# Patient Record
Sex: Male | Born: 1950 | Race: Black or African American | Hispanic: No | Marital: Single
Health system: Midwestern US, Community
[De-identification: ages and names within clinical notes are randomized; demographics above are authoritative.]

## PROBLEM LIST (undated history)

## (undated) DIAGNOSIS — J189 Pneumonia, unspecified organism: Secondary | ICD-10-CM

## (undated) DIAGNOSIS — I10 Essential (primary) hypertension: Secondary | ICD-10-CM

## (undated) DIAGNOSIS — E119 Type 2 diabetes mellitus without complications: Secondary | ICD-10-CM

## (undated) HISTORY — PX: APPENDECTOMY: SHX54

---

## 2007-08-06 NOTE — ED Provider Notes (Signed)
Back Pain   Associated symptoms include abdominal pain.   Abdominal Pain   Associated symptoms include back pain.        Past Medical History   Diagnosis Date   ??? Hypertension    ??? Diabetes           Past Surgical History   Procedure Date   ??? Hx appendectomy            No family history on file.     History   Social History   ??? Marital Status: Single     Spouse Name: N/A     Number of Children: N/A   ??? Years of Education: N/A   Occupational History   ??? Not on file.   Social History Main Topics   ??? Tobacco Use: Yes -- 0.5 packs/day   ??? Alcohol Use: Yes   ??? Drug Use: No   ??? Sexually Active:    Other Topics Concern   ??? Not on file   Social History Narrative   ??? No narrative on file           ALLERGIES: Review of patient's allergies indicates no known allergies.      Review of Systems   Gastrointestinal: Positive for abdominal pain.   Musculoskeletal: Positive for back pain.       Filed Vitals:    08/06/2007 12:46 PM   BP: 108/59   Pulse: 71   Temp: 97.9 ??F (36.6 ??C)   Resp: 16   Height: 5\' 11"  (1.803 m)   Weight: 202 lb (91.627 kg)   SpO2: 97%              Physical Exam         Coding      The patient has been evaluated and determined not to have a medical emergency. Vital signs are stable and no acute distress noted. Pleasant and cooperative. Medical screening has been explained to the patient with follow up clinics given to patient.

## 2013-11-27 ENCOUNTER — Emergency Department (HOSPITAL_COMMUNITY): Payer: Self-pay

## 2013-11-27 ENCOUNTER — Emergency Department (HOSPITAL_COMMUNITY)
Admission: EM | Admit: 2013-11-27 | Discharge: 2013-11-27 | Disposition: A | Discharge status: Home / Self Care | Payer: Self-pay | Attending: Emergency Medicine | Admitting: Emergency Medicine

## 2013-11-27 ENCOUNTER — Encounter (HOSPITAL_COMMUNITY): Payer: Self-pay | Admitting: Emergency Medicine

## 2013-11-27 DIAGNOSIS — I10 Essential (primary) hypertension: Secondary | ICD-10-CM | POA: Insufficient documentation

## 2013-11-27 DIAGNOSIS — R1110 Vomiting, unspecified: Secondary | ICD-10-CM

## 2013-11-27 DIAGNOSIS — Z79899 Other long term (current) drug therapy: Secondary | ICD-10-CM | POA: Insufficient documentation

## 2013-11-27 DIAGNOSIS — E119 Type 2 diabetes mellitus without complications: Secondary | ICD-10-CM | POA: Insufficient documentation

## 2013-11-27 DIAGNOSIS — Z9089 Acquired absence of other organs: Secondary | ICD-10-CM | POA: Insufficient documentation

## 2013-11-27 DIAGNOSIS — Z720 Tobacco use: Secondary | ICD-10-CM | POA: Insufficient documentation

## 2013-11-27 DIAGNOSIS — K529 Noninfective gastroenteritis and colitis, unspecified: Secondary | ICD-10-CM | POA: Insufficient documentation

## 2013-11-27 HISTORY — DX: Type 2 diabetes mellitus without complications: E11.9

## 2013-11-27 HISTORY — DX: Essential (primary) hypertension: I10

## 2013-11-27 LAB — COMPREHENSIVE METABOLIC PANEL WITH GFR
ALT: 39 U/L (ref 0–53)
AST: 52 U/L — ABNORMAL HIGH (ref 0–37)
Albumin: 4.1 g/dL (ref 3.5–5.2)
Alkaline Phosphatase: 135 U/L — ABNORMAL HIGH (ref 39–117)
Anion gap: 17 — ABNORMAL HIGH (ref 5–15)
BUN: 17 mg/dL (ref 6–23)
CO2: 26 meq/L (ref 19–32)
Calcium: 10.1 mg/dL (ref 8.4–10.5)
Chloride: 95 meq/L — ABNORMAL LOW (ref 96–112)
Creatinine, Ser: 0.87 mg/dL (ref 0.50–1.35)
GFR calc Af Amer: >90 mL/min
GFR calc non Af Amer: 89 mL/min — ABNORMAL LOW
Glucose, Bld: 131 mg/dL — ABNORMAL HIGH (ref 70–99)
Potassium: 4.5 meq/L (ref 3.7–5.3)
Sodium: 138 meq/L (ref 137–147)
Total Bilirubin: 0.5 mg/dL (ref 0.3–1.2)
Total Protein: 8.5 g/dL — ABNORMAL HIGH (ref 6.0–8.3)

## 2013-11-27 LAB — CBC WITH DIFFERENTIAL/PLATELET
BASOS PCT: 0 % (ref 0–1)
Basophils Absolute: 0 10*3/uL (ref 0.0–0.1)
EOS ABS: 0 10*3/uL (ref 0.0–0.7)
EOS PCT: 0 % (ref 0–5)
HCT: 43.2 % (ref 39.0–52.0)
Hemoglobin: 14.7 g/dL (ref 13.0–17.0)
LYMPHS ABS: 1.3 10*3/uL (ref 0.7–4.0)
Lymphocytes Relative: 11 % — ABNORMAL LOW (ref 12–46)
MCH: 27 pg (ref 26.0–34.0)
MCHC: 34 g/dL (ref 30.0–36.0)
MCV: 79.3 fL (ref 78.0–100.0)
Monocytes Absolute: 0.7 10*3/uL (ref 0.1–1.0)
Monocytes Relative: 6 % (ref 3–12)
NEUTROS PCT: 83 % — AB (ref 43–77)
Neutro Abs: 9.2 10*3/uL — ABNORMAL HIGH (ref 1.7–7.7)
Platelets: 293 10*3/uL (ref 150–400)
RBC: 5.45 MIL/uL (ref 4.22–5.81)
RDW: 12.7 % (ref 11.5–15.5)
WBC: 11.1 10*3/uL — ABNORMAL HIGH (ref 4.0–10.5)

## 2013-11-27 LAB — LIPASE, BLOOD: Lipase: 65 U/L — ABNORMAL HIGH (ref 11–59)

## 2013-11-27 MED ORDER — ONDANSETRON 4 MG PO TBDP: ORAL_TABLET | ORAL | Status: AC

## 2013-11-27 MED ORDER — LISINOPRIL 10 MG PO TABS: 20.0000 mg | ORAL_TABLET | Freq: Every day | ORAL | Status: AC

## 2013-11-27 MED ORDER — DICYCLOMINE HCL 20 MG PO TABS: ORAL_TABLET | ORAL | Status: AC

## 2013-11-27 MED ORDER — ONDANSETRON HCL 4 MG/2ML IJ SOLN
4.0000 mg | Freq: Once | INTRAMUSCULAR | Status: DC
  Filled 2013-11-27: qty 2

## 2013-11-27 MED ORDER — SODIUM CHLORIDE 0.9 % IV BOLUS (SEPSIS)
1000.0000 mL | Freq: Once | INTRAVENOUS | Status: AC
  Administered 2013-11-27: 1000 mL via INTRAVENOUS

## 2013-11-27 MED ORDER — ONDANSETRON 4 MG PO TBDP
4.0000 mg | ORAL_TABLET | Freq: Once | ORAL | Status: AC
  Administered 2013-11-27: 4 mg via ORAL
  Filled 2013-11-27: qty 1

## 2013-11-27 NOTE — ED Notes (Signed)
Pt given ginger ale.

## 2013-11-27 NOTE — ED Notes (Addendum)
63 yo male c/o of N/V and decreased intake for 4 days. Denies Chest pain or SOB. A/O X4 . HX of heroine use x7 days a week.

## 2013-11-27 NOTE — ED Notes (Signed)
IV Team at bedside 

## 2013-11-27 NOTE — Discharge Instructions (Signed)
Drink plenty of fluids and follow up if not improving. °

## 2013-11-27 NOTE — ED Provider Notes (Signed)
CSN: [id number]     Arrival date & time 11/27/13  0745 History   First MD Initiated Contact with Patient 11/27/13 [phone]     Chief Complaint  Patient presents with  . Nausea  . Emesis  . Dehydration     (Consider location/radiation/quality/duration/timing/severity/associated sxs/prior Treatment) Patient is a 63 y.o. male presenting with vomiting. The history is provided by the patient (pt complains of vomiting for a couple days).  Emesis Severity:  Moderate Timing:  Constant Quality:  Undigested food Feeding tolerance: nothing. Progression:  Unchanged Chronicity:  New Recent urination:  Normal Associated symptoms: no abdominal pain, no diarrhea and no headaches     Past Medical History  Diagnosis Date  . Diabetes mellitus without complication   . Hypertension    Past Surgical History  Procedure Laterality Date  . Appendectomy     History reviewed. No pertinent family history. History  Substance Use Topics  . Smoking status: Light Tobacco Smoker -- 0.25 packs/day    Types: Cigarettes  . Smokeless tobacco: Not on file  . Alcohol Use: 3.6 oz/week    6 Cans of beer per week    Review of Systems  Constitutional: Negative for appetite change and fatigue.  HENT: Negative for congestion, ear discharge and sinus pressure.   Eyes: Negative for discharge.  Respiratory: Negative for cough.   Cardiovascular: Negative for chest pain.  Gastrointestinal: Positive for vomiting. Negative for abdominal pain and diarrhea.  Genitourinary: Negative for frequency and hematuria.  Musculoskeletal: Negative for back pain.  Skin: Negative for rash.  Neurological: Negative for seizures and headaches.  Psychiatric/Behavioral: Negative for hallucinations.      Allergies  Review of patient's allergies indicates no known allergies.  Home Medications   Prior to Admission medications   Medication Sig Start Date End Date Taking? Authorizing Provider  lisinopril (PRINIVIL,ZESTRIL) 10 MG  tablet Take 10 mg by mouth daily.   Yes Historical Provider, MD   BP 152/76 mmHg  Pulse 60  Temp(Src) 99.3 F (37.4 C) (Oral)  Resp 17  Ht 5\' 11"  (1.803 m)  SpO2 96% Physical Exam  Constitutional: He is oriented to person, place, and time. He appears well-developed.  HENT:  Head: Normocephalic.  Eyes: Conjunctivae and EOM are normal. No scleral icterus.  Neck: Neck supple. No thyromegaly present.  Cardiovascular: Normal rate and regular rhythm.  Exam reveals no gallop and no friction rub.   No murmur heard. Pulmonary/Chest: No stridor. He has no wheezes. He has no rales. He exhibits no tenderness.  Abdominal: He exhibits no distension. There is no tenderness. There is no rebound.  Musculoskeletal: Normal range of motion. He exhibits no edema.  Lymphadenopathy:    He has no cervical adenopathy.  Neurological: He is oriented to person, place, and time. He exhibits normal muscle tone. Coordination normal.  Skin: No rash noted. No erythema.  Psychiatric: He has a normal mood and affect. His behavior is normal.    ED Course  Procedures (including critical care time) Labs Review Labs Reviewed  CBC WITH DIFFERENTIAL - Abnormal; Notable for the following:    WBC 11.1 (*)    Neutrophils Relative % 83 (*)    Neutro Abs 9.2 (*)    Lymphocytes Relative 11 (*)    All other components within normal limits  COMPREHENSIVE METABOLIC PANEL - Abnormal; Notable for the following:    Chloride 95 (*)    Glucose, Bld 131 (*)    Total Protein 8.5 (*)    AST 52 (*)  Alkaline Phosphatase 135 (*)    GFR calc non Af Amer 89 (*)    Anion gap 17 (*)    All other components within normal limits  LIPASE, BLOOD - Abnormal; Notable for the following:    Lipase 65 (*)    All other components within normal limits    Imaging Review Dg Abd Acute W/chest  11/27/2013   CLINICAL DATA:  Nausea and vomiting for 4 days  EXAM: ACUTE ABDOMEN SERIES (ABDOMEN 2 VIEW & CHEST 1 VIEW)  COMPARISON:  None.   FINDINGS: Cardiomediastinal silhouette is unremarkable. No acute infiltrate or pleural effusion. No pulmonary edema. There is nonobstructive small bowel gas pattern. Mild gaseous distension of the right colon and transverse colon without colonic air-fluid levels. Mild colonic ileus or colitis cannot be excluded. Clinical correlation is necessary. No free abdominal air.  IMPRESSION: No acute disease within chest. Mild gaseous distension of the right colon and transverse colon. Mild colonic ileus or colitis cannot be excluded. Clinical correlation is necessary. No free abdominal air.   Electronically Signed   By: Natasha MeadLiviu  Pop M.D.   On: 11/27/2013 08:37     EKG Interpretation None      MDM   Final diagnoses:  Vomiting    Pt drank 500 cc of ginger ale and did not vomit.  He will be sent home with zofran      Benny LennertJoseph L Ashely Goosby, MD 11/27/13 [phone]

## 2013-11-27 NOTE — ED Notes (Signed)
Pt tolerating PO intake

## 2013-11-27 NOTE — ED Notes (Signed)
This RN attempted IV Ultrasound x2. Unsuccessful

## 2014-03-16 ENCOUNTER — Inpatient Hospital Stay
Admit: 2014-03-16 | Discharge: 2014-03-16 | Disposition: A | Discharge status: Home / Self Care | Payer: Self-pay | Attending: Emergency Medicine

## 2014-03-16 ENCOUNTER — Emergency Department: Admit: 2014-03-16 | Payer: Self-pay

## 2014-03-16 DIAGNOSIS — S134XXA Sprain of ligaments of cervical spine, initial encounter: Secondary | ICD-10-CM

## 2014-03-16 MED ORDER — CYCLOBENZAPRINE 10 MG TAB
10 mg | ORAL_TABLET | Freq: Three times a day (TID) | ORAL | Status: DC | PRN
Start: 2014-03-16 — End: 2014-05-14
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
## 2014-03-16 NOTE — ED Notes (Signed)
I have reviewed discharge instructions with the patient.  The patient verbalized understanding. Given instructions and 1 script ambulatory at discharge gait steady

## 2014-05-14 ENCOUNTER — Emergency Department: Admit: 2014-05-14 | Payer: Self-pay

## 2014-05-14 ENCOUNTER — Inpatient Hospital Stay
Admit: 2014-05-14 | Discharge: 2014-05-16 | Disposition: A | Discharge status: Home / Self Care | Payer: Self-pay | Attending: Pulmonary Disease | Admitting: Pulmonary Disease

## 2014-05-14 DIAGNOSIS — J189 Pneumonia, unspecified organism: Principal | ICD-10-CM

## 2014-05-14 LAB — METABOLIC PANEL, COMPREHENSIVE
A-G Ratio: 0.5 — ABNORMAL LOW (ref 1.2–3.5)
ALT (SGPT): 63 U/L (ref 12–65)
AST (SGOT): 60 U/L — ABNORMAL HIGH (ref 15–37)
Albumin: 3.2 g/dL (ref 3.2–4.6)
Alk. phosphatase: 157 U/L — ABNORMAL HIGH (ref 50–136)
Anion gap: 10 mmol/L (ref 7–16)
BUN: 27 MG/DL — ABNORMAL HIGH (ref 8–23)
Bilirubin, total: 1.5 MG/DL — ABNORMAL HIGH (ref 0.2–1.1)
CO2: 27 mmol/L (ref 21–32)
Calcium: 9.5 MG/DL (ref 8.3–10.4)
Chloride: 98 mmol/L (ref 98–107)
Creatinine: 1.02 MG/DL (ref 0.8–1.5)
GFR est AA: >60 mL/min/{1.73_m2} (ref >60)
GFR est non-AA: >60 mL/min/{1.73_m2} (ref >60)
Globulin: 6.6 g/dL — ABNORMAL HIGH (ref 2.3–3.5)
Glucose: 127 mg/dL — ABNORMAL HIGH (ref 65–100)
Potassium: 3.2 mmol/L — ABNORMAL LOW (ref 3.5–5.1)
Protein, total: 9.8 g/dL — ABNORMAL HIGH (ref 6.3–8.2)
Sodium: 135 mmol/L — ABNORMAL LOW (ref 136–145)

## 2014-05-14 LAB — LACTIC ACID: Lactic acid: 1.9 MMOL/L (ref 0.4–2.0)

## 2014-05-14 LAB — CBC WITH AUTOMATED DIFF
ABS. BASOPHILS: 0 10*3/uL (ref 0.0–0.2)
ABS. EOSINOPHILS: 0 10*3/uL (ref 0.0–0.8)
ABS. IMM. GRANS.: 0.2 10*3/uL (ref 0.0–0.5)
ABS. LYMPHOCYTES: 1.4 10*3/uL (ref 0.5–4.6)
ABS. MONOCYTES: 1.1 10*3/uL (ref 0.1–1.3)
ABS. NEUTROPHILS: 15.6 10*3/uL — ABNORMAL HIGH (ref 1.7–8.2)
BASOPHILS: 0 % (ref 0.0–2.0)
EOSINOPHILS: 0 % — ABNORMAL LOW (ref 0.5–7.8)
HCT: 43.1 % (ref 41.1–50.3)
HGB: 14.9 g/dL (ref 13.6–17.2)
LYMPHOCYTES: 8 % — ABNORMAL LOW (ref 13–44)
MCH: 27.4 PG (ref 26.1–32.9)
MCHC: 34.6 g/dL (ref 31.4–35.0)
MCV: 79.2 FL — ABNORMAL LOW (ref 79.6–97.8)
MONOCYTES: 6 % (ref 4.0–12.0)
MPV: 10.1 FL — ABNORMAL LOW (ref 10.8–14.1)
NEUTROPHILS: 86 % — ABNORMAL HIGH (ref 43–78)
PLATELET COMMENTS: ADEQUATE
PLATELET: 276 10*3/uL (ref 150–450)
RBC: 5.44 M/uL (ref 4.23–5.67)
RDW: 12.9 % (ref 11.9–14.6)
WBC: 18.1 10*3/uL — ABNORMAL HIGH (ref 4.3–11.1)

## 2014-05-14 LAB — BNP: BNP: 26 pg/mL

## 2014-05-14 LAB — LIPASE: Lipase: 198 U/L (ref 73–393)

## 2014-05-14 LAB — PROCALCITONIN: Procalcitonin: 2.4 ng/mL

## 2014-05-14 MED ORDER — SODIUM CHLORIDE 0.9% BOLUS IV
0.9 % | Freq: Once | INTRAVENOUS | Status: AC
Start: 2014-05-14 — End: 2014-05-14
  Administered 2014-05-14: 21:00:00 via INTRAVENOUS

## 2014-05-14 MED ORDER — SODIUM CHLORIDE 0.9% BOLUS IV
0.9 % | Freq: Once | INTRAVENOUS | Status: DC
Start: 2014-05-14 — End: 2014-05-14

## 2014-05-14 MED ORDER — IPRATROPIUM-ALBUTEROL 2.5 MG-0.5 MG/3 ML NEB SOLUTION
2.5 mg-0.5 mg/3 ml | RESPIRATORY_TRACT | Status: AC
Start: 2014-05-14 — End: 2014-05-14
  Administered 2014-05-14: 20:00:00 via RESPIRATORY_TRACT

## 2014-05-14 MED ORDER — LEVOFLOXACIN IN D5W 750 MG/150 ML IV PIGGY BACK
750 mg/150 mL | INTRAVENOUS | Status: AC
Start: 2014-05-14 — End: 2014-05-14
  Administered 2014-05-14: 22:00:00 via INTRAVENOUS

## 2014-05-14 MED FILL — LEVOFLOXACIN IN D5W 750 MG/150 ML IV PIGGY BACK: 750 mg/150 mL | INTRAVENOUS | Qty: 150

## 2014-05-14 MED FILL — IPRATROPIUM-ALBUTEROL 2.5 MG-0.5 MG/3 ML NEB SOLUTION: 2.5 mg-0.5 mg/3 ml | RESPIRATORY_TRACT | Qty: 3

## 2014-05-14 NOTE — H&P (Signed)
HISTORY AND PHYSICAL      Gene King    05/14/2014    Date of Admission:  05/14/2014    The patient's chart is reviewed and the patient is discussed with the staff.    Subjective:     Patient is a 64 y.o. African American male admitted to the floor from the ED with CAP.  His initial complaints focused on some abdominal pain over the last few days, an d he says he has not eaten anything in 5 days.  Earlier today, he became short of breath and started coughing.  Abdominal series was obtained,  and he was noted to have in filtrates on the right.  He is now admitted for further management of CAP.       Review of Systems  Review of systems not obtained due to patient factors.      Patient Active Problem List   Diagnosis Code   ??? CAP (community acquired pneumonia) J18.9   ??? Leukocytosis D72.829   ??? Essential hypertension I10   ??? Diabetes mellitus type 2, controlled (HCC) E11.9       Prior to Admission Medications   Prescriptions Last Dose Informant Patient Reported? Taking?   cyclobenzaprine (FLEXERIL) 10 mg tablet   Yes No      Facility-Administered Medications: None       History reviewed. No pertinent past medical history.  Past Surgical History   Procedure Laterality Date   ??? Hx appendectomy       History     Social History   ??? Marital Status: SINGLE     Spouse Name: N/A   ??? Number of Children: N/A   ??? Years of Education: N/A     Occupational History   ??? Landscaper      Retired     Social History Main Topics   ??? Smoking status: Current Every Day Smoker -- 0.50 packs/day for 40 years     Types: Cigarettes   ??? Smokeless tobacco: Never Used   ??? Alcohol Use: 6.0 oz/week     10 Standard drinks or equivalent per week   ??? Drug Use: No   ??? Sexual Activity: Not on file       Social History Narrative    There is no known exposure to TB.  There is no significant environmental or industrial exposure.         Family History   Problem Relation Age of Onset   ??? Other Father      Deceased    ??? Other Mother      In assisted living       No Known Allergies      Current Facility-Administered Medications   Medication Dose Route Frequency   ??? levofloxacin (LEVAQUIN) 750 mg in D5W IVPB  750 mg IntraVENous NOW     Current Outpatient Prescriptions   Medication Sig   ??? cyclobenzaprine (FLEXERIL) 10 mg tablet        Objective:     Filed Vitals:    05/14/14 1331 05/14/14 1546 05/14/14 1637 05/14/14 1744   BP:   179/85 178/90   Pulse:   65 67   Temp:       Resp:   16 17   Height:       Weight:       SpO2: 89% 94% 97% 98%       PHYSICAL EXAM     Constitutional:  the patient is well developed and in  no acute distress  EENMT:  Sclera clear, pupils equal, oral mucosa moist  Respiratory: coarse rhonchi on the right.  Cardiovascular:  RRR without M,G,R  Gastrointestinal: soft and non-tender; with positive bowel sounds.  Musculoskeletal: warm without cyanosis. There is no lower leg edema.  Skin:  no jaundice or rashes  Neurologic: no gross neuro deficits     Psychiatric:  Unable to assess at this time.      Chest Xray:      RUL and RML infiltrates.      Recent Labs      05/14/[id number]   WBC  18.1*   HGB  14.9   HCT  43.1   PLT  276     Recent Labs      17-May-2014   1625   NA  135*   K  3.2*   CL  98   GLU  127*   CO2  27   BUN  27*   CREA  1.02   CA  9.5   ALB  3.2   TBILI  1.5*   ALT  63   SGOT  60*   BNPP  26       Assessment:  (Medical Decision Making)     Hospital Problems  Date Reviewed: 2014-05-17          Codes Class Noted POA    * (Principal)CAP (community acquired pneumonia) ICD-10-CM: J18.9  ICD-9-CM: 486  May 17, 2014 Yes    Leukocytosis  Will start on Levaquin. ICD-10-CM: Y78.295  ICD-9-CM: 288.60  2014-05-17 Yes        Diabetes mellitus type 2, controlled (HCC) (Chronic)  On no medicines.  Will check A1C. ICD-10-CM: E11.9  ICD-9-CM: 250.00  05/17/14 Yes              Plan:  (Medical Decision Making)     --Will admit for further medical management  --Supplemental O2   --Respiratory nebulizer treatments  --culture   --antibiotic therapy    More than 50% of the time documented was spent in face-to-face contact with the patient and in the care of the patient on the floor/unit where the patient is located.    Sharmon Leyden, MD

## 2014-05-14 NOTE — ED Notes (Signed)
25 minutes provided for receiving RN to review chart and call with questions.

## 2014-05-14 NOTE — Progress Notes (Signed)
TRANSFER - IN REPORT:    Verbal report received from Orvan FalconerJoseph Larson, RN on Queen BlossomJames Edward Langill  being received from Ed for routine progression of care      Report consisted of patient???s Situation, Background, Assessment and   Recommendations(SBAR).     Information from the following report(s) SBAR, Kardex and ED Summary was reviewed with the receiving nurse.    Opportunity for questions and clarification was provided.      Assessment completed upon patient???s arrival to unit and care assumed.

## 2014-05-14 NOTE — ED Notes (Signed)
Unable to obtain blood for labs. Lab called to attempt blood draw. Dr. Lou Minerhorn aware of delay in patient care.

## 2014-05-14 NOTE — ED Provider Notes (Signed)
HPI Comments: ppatient is a 64 year old male who is coming in with some upper abdominal pain with some vomiting and diarrhea for the last few days.  He also reports subjective fever and has had a productive cough as well. He states he normally smokes some but does not have any chronic lung problems.  He has had an appendectomy in the past.    The history is provided by the patient.        Past Medical History:   Diagnosis Date   ??? Hypertension    ??? Diabetes [facility])        Past Surgical History:   Procedure Laterality Date   ??? Hx appendectomy           History reviewed. No pertinent family history.    History     Social History   ??? Marital Status: SINGLE     Spouse Name: N/A   ??? Number of Children: N/A   ??? Years of Education: N/A     Occupational History   ??? Not on file.     Social History Main Topics   ??? Smoking status: Current Every Day Smoker -- 0.50 packs/day   ??? Smokeless tobacco: Not on file   ??? Alcohol Use: Yes   ??? Drug Use: No   ??? Sexual Activity: Not on file     Other Topics Concern   ??? Not on file     Social History Narrative           ALLERGIES: Review of patient's allergies indicates no known allergies.      Review of Systems   Constitutional: Positive for fever and chills.   Respiratory: Negative for chest tightness, shortness of breath, wheezing and stridor.    Cardiovascular: Negative for chest pain and palpitations.   Gastrointestinal: Negative for nausea, vomiting, abdominal pain and diarrhea.   Skin: Negative.    All other systems reviewed and are negative.      Filed Vitals:    05/14/14 1327 05/14/14 1331   BP: 139/71    Pulse: 77    Temp: 97.8 ??F (36.6 ??C)    Resp: 16    Height: 5' 11"  (1.803 m)    Weight: 77.111 kg (170 lb)    SpO2: 87% 89%            Physical Exam   Constitutional: He is oriented to person, place, and time. He appears well-developed and well-nourished. No distress.   HENT:   Head: Normocephalic and atraumatic.   Eyes: Conjunctivae are normal. No scleral icterus.    Neck: Normal range of motion. Neck supple.   Cardiovascular: Normal rate, regular rhythm and normal heart sounds.    Pulmonary/Chest: Effort normal and breath sounds normal. No stridor. No respiratory distress. He has no rales. He exhibits no tenderness.   Scattered wheezes and rhonchi intermittent coughing   Abdominal: Soft. There is no tenderness. There is no rebound and no guarding.   Neurological: He is alert and oriented to person, place, and time.   No focal weakness   Skin: Skin is warm and dry. No rash noted. He is not diaphoretic.   Psychiatric: He has a normal mood and affect. His behavior is normal.   Nursing note and vitals reviewed.       MDM  Number of Diagnoses or Management Options  Community acquired pneumonia:   Diagnosis management comments: Patient's lungs still sound bad and he has a pneumonia on his x-ray and a white  count of 18,000 and getting cultures and       Amount and/or Complexity of Data Reviewed  Clinical lab tests: ordered and reviewed (Results for orders placed or performed during the hospital encounter of 05/14/14  -CBC WITH AUTOMATED DIFF       Result                                            Value                         Ref Range                       WBC                                               18.1 (H)                      4.3 - 11.1 K/uL                 RBC                                               5.44                          4.23 - 5.67 M/uL                HGB                                               14.9                          13.6 - 17.2 g/dL                HCT                                               43.1                          41.1 - 50.3 %                   MCV                                               79.2 (L)                      79.6 - 97.8 FL  MCH                                               27.4                          26.1 - 32.9 PG                  MCHC                                              34.6                           31.4 - 35.0 g/dL                RDW                                               12.9                          11.9 - 14.6 %                   PLATELET                                          276                           150 - 450 K/uL                  MPV                                               10.1 (L)                      10.8 - 14.1 FL                  NEUTROPHILS                                       86 (H)                        43 - 78 %                       LYMPHOCYTES                                       8 (L)  13 - 44 %                       MONOCYTES                                         6                             4.0 - 12.0 %                    EOSINOPHILS                                       0 (L)                         0.5 - 7.8 %                     BASOPHILS                                         0                             0.0 - 2.0 %                     ABS. NEUTROPHILS                                  15.6 (H)                      1.7 - 8.2 K/UL                  ABS. LYMPHOCYTES                                  1.4                           0.5 - 4.6 K/UL                  ABS. MONOCYTES                                    1.1                           0.1 - 1.3 K/UL                  ABS. EOSINOPHILS                                  0.0  0.0 - 0.8 K/UL                  ABS. BASOPHILS                                    0.0                           0.0 - 0.2 K/UL                  ABS. IMM. GRANS.                                  0.2                           0.0 - 0.5 K/UL                  RBC COMMENTS                                      SLIGHT POLYCHROMASIA                                          RBC COMMENTS                                                                                                OCCASIONAL   TARGET CELLS          PLATELET COMMENTS                                 ADEQUATE                                                       DF                                                AUTOMATED                                                -METABOLIC PANEL, COMPREHENSIVE       Result  Value                         Ref Range                       Sodium                                            135 (L)                       136 - 145 mmol/L                Potassium                                         3.2 (L)                       3.5 - 5.1 mmol/L                Chloride                                          98                            98 - 107 mmol/L                 CO2                                               27                            21 - 32 mmol/L                  Anion gap                                         10                            7 - 16 mmol/L                   Glucose                                           127 (H)                       65 - 100 mg/dL                  BUN  27 (H)                        8 - 23 MG/DL                    Creatinine                                        1.02                          0.8 - 1.5 MG/DL                 GFR est AA                                        >60                           >60 ml/min/1.84m               GFR est non-AA                                    >60                           >60 ml/min/1.724m              Calcium                                           9.5                           8.3 - 10.4 MG/DL                Bilirubin, total                                  1.5 (H)                       0.2 - 1.1 MG/DL                 ALT                                               63                            12 - 65 U/L                     AST  60 (H)                        15 - 37 U/L                     Alk. phosphatase                                  157 (H)                        50 - 136 U/L                    Protein, total                                    9.8 (H)                       6.3 - 8.2 g/dL                  Albumin                                           3.2                           3.2 - 4.6 g/dL                  Globulin                                          6.6 (H)                       2.3 - 3.5 g/dL                  A-G Ratio                                         0.5 (L)                       1.2 - 3.5                  -LIPASE       Result                                            Value                         Ref Range                       Lipase  198                           73 - 393 U/L               -BNP       Result                                            Value                         Ref Range                       BNP                                               26                            pg/mL                     )  Tests in the radiology section of CPT??: ordered and reviewed (Xr Abd Acute W 1 V Chest    05/14/2014   Acute abdominal series.  CLINICAL INDICATION: Severe lower abdominal pain with diarrhea for four days  FINDINGS: Patchy opacities noted in the right mid and lower lung. No free air noted beneath the diaphragm. Air-filled loops of nondilated large and small bowel noted throughout the entirety of the abdomen and pelvis including distally. No findings present to strongly suspect obstruction.     05/14/2014   IMPRESSION: Patchy airspace opacity in the right mid and lower lung. Pneumonia could be considered. The bowel gas pattern is nonobstructive.    )        Procedures

## 2014-05-14 NOTE — Progress Notes (Signed)
Respirations even and unlabored. No s/sx distress. No needs at present. No pain or SOB. Assessment completed.

## 2014-05-14 NOTE — ED Notes (Signed)
Pt with abd pain and fever that started this past Thursday. Pt with diarrhea.
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
## 2014-05-15 LAB — GLUCOSE, POC
Glucose (POC): 109 mg/dL — ABNORMAL HIGH (ref 65–100)
Glucose (POC): 139 mg/dL — ABNORMAL HIGH (ref 65–100)
Glucose (POC): 154 mg/dL — ABNORMAL HIGH (ref 65–100)
Glucose (POC): 144 mg/dL — ABNORMAL HIGH (ref 65–100)

## 2014-05-15 LAB — METABOLIC PANEL, BASIC
Anion gap: 9 mmol/L (ref 7–16)
BUN: 22 MG/DL (ref 8–23)
CO2: 23 mmol/L (ref 21–32)
Calcium: 8.4 MG/DL (ref 8.3–10.4)
Chloride: 105 mmol/L (ref 98–107)
Creatinine: 0.7 MG/DL — ABNORMAL LOW (ref 0.8–1.5)
GFR est AA: >60 mL/min/{1.73_m2} (ref >60)
GFR est non-AA: >60 mL/min/{1.73_m2} (ref >60)
Glucose: 127 mg/dL — ABNORMAL HIGH (ref 65–100)
Potassium: 3.3 mmol/L — ABNORMAL LOW (ref 3.5–5.1)
Sodium: 137 mmol/L (ref 136–145)

## 2014-05-15 LAB — HEMOGLOBIN A1C WITH EAG
Est. average glucose: 123 mg/dL
Hemoglobin A1c: 5.9 % (ref 4.8–6.0)

## 2014-05-15 LAB — MAGNESIUM: Magnesium: 2.2 mg/dL (ref 1.8–2.4)

## 2014-05-15 MED ORDER — POTASSIUM CHLORIDE SR 20 MEQ TAB, PARTICLES/CRYSTALS
20 mEq | ORAL | Status: AC
Start: 2014-05-15 — End: 2014-05-15
  Administered 2014-05-15: 14:00:00 via ORAL

## 2014-05-15 MED ORDER — SODIUM CHLORIDE 0.9 % IJ SYRG
Freq: Three times a day (TID) | INTRAMUSCULAR | Status: DC
Start: 2014-05-15 — End: 2014-05-16
  Administered 2014-05-15 – 2014-05-16 (×4): via INTRAVENOUS

## 2014-05-15 MED ORDER — ALBUTEROL SULFATE 0.083 % (0.83 MG/ML) SOLN FOR INHALATION
2.5 mg /3 mL (0.083 %) | Freq: Four times a day (QID) | RESPIRATORY_TRACT | Status: DC
Start: 2014-05-15 — End: 2014-05-16
  Administered 2014-05-15 – 2014-05-16 (×6): via RESPIRATORY_TRACT

## 2014-05-15 MED ORDER — VANCOMYCIN IN 0.9% SODIUM CHLORIDE 1.5 G/500 ML IV
1.5 g/500 mL | Freq: Once | INTRAVENOUS | Status: AC
Start: 2014-05-15 — End: 2014-05-16
  Administered 2014-05-16: via INTRAVENOUS

## 2014-05-15 MED ORDER — ENOXAPARIN 40 MG/0.4 ML SUB-Q SYRINGE
40 mg/0.4 mL | SUBCUTANEOUS | Status: DC
Start: 2014-05-15 — End: 2014-05-16
  Administered 2014-05-15 – 2014-05-16 (×2): via SUBCUTANEOUS

## 2014-05-15 MED ORDER — LEVOFLOXACIN IN D5W 750 MG/150 ML IV PIGGY BACK
750 mg/150 mL | INTRAVENOUS | Status: DC
Start: 2014-05-15 — End: 2014-05-15
  Administered 2014-05-15 – 2014-05-16 (×2): via INTRAVENOUS

## 2014-05-15 MED ORDER — SODIUM CHLORIDE 0.9 % IJ SYRG
INTRAMUSCULAR | Status: DC | PRN
Start: 2014-05-15 — End: 2014-05-16

## 2014-05-15 MED ORDER — GUAIFENESIN SR 600 MG TAB
600 mg | Freq: Two times a day (BID) | ORAL | Status: DC
Start: 2014-05-15 — End: 2014-05-16
  Administered 2014-05-15 – 2014-05-16 (×4): via ORAL

## 2014-05-15 MED ORDER — VANCOMYCIN IN 0.9 % SODIUM CHLORIDE 1.25 GRAM/250 ML IV
1.25 gram/250 mL | Freq: Two times a day (BID) | INTRAVENOUS | Status: DC
Start: 2014-05-15 — End: 2014-05-15

## 2014-05-15 MED ORDER — SODIUM CHLORIDE 0.9 % IV
INTRAVENOUS | Status: DC
Start: 2014-05-15 — End: 2014-05-15
  Administered 2014-05-15: 03:00:00 via INTRAVENOUS

## 2014-05-15 MED ORDER — POTASSIUM CHLORIDE SR 20 MEQ TAB, PARTICLES/CRYSTALS
20 mEq | Freq: Three times a day (TID) | ORAL | Status: AC
Start: 2014-05-15 — End: 2014-05-15
  Administered 2014-05-15 (×2): via ORAL

## 2014-05-15 MED ORDER — PNEUMOCOCCAL 23-VALPS VACCINE 25 MCG/0.5 ML INJECTION
25 mcg/0.5 mL | INTRAMUSCULAR | Status: AC
Start: 2014-05-15 — End: 2014-05-16
  Administered 2014-05-16: 14:00:00 via INTRAMUSCULAR

## 2014-05-15 MED FILL — KLOR-CON M20 MEQ TABLET,EXTENDED RELEASE: 20 mEq | ORAL | Qty: 2

## 2014-05-15 MED FILL — ALBUTEROL SULFATE 0.083 % (0.83 MG/ML) SOLN FOR INHALATION: 2.5 mg /3 mL (0.083 %) | RESPIRATORY_TRACT | Qty: 1

## 2014-05-15 MED FILL — LOVENOX 40 MG/0.4 ML SUBCUTANEOUS SYRINGE: 40 mg/0.4 mL | SUBCUTANEOUS | Qty: 0.4

## 2014-05-15 MED FILL — SODIUM CHLORIDE 0.9 % IV: INTRAVENOUS | Qty: 1000

## 2014-05-15 MED FILL — VANCOMYCIN IN 0.9% SODIUM CHLORIDE 1.5 G/500 ML IV: 1.5 g/500 mL | INTRAVENOUS | Qty: 500

## 2014-05-15 MED FILL — GUAIFENESIN SR 600 MG TAB: 600 mg | ORAL | Qty: 2

## 2014-05-15 MED FILL — LEVOFLOXACIN IN D5W 750 MG/150 ML IV PIGGY BACK: 750 mg/150 mL | INTRAVENOUS | Qty: 150

## 2014-05-15 NOTE — Progress Notes (Addendum)
Problem: Nutrition Deficit  Goal: *Optimize nutritional status  Nutrition    MST (malnutrition screen tool ) referral for reason of :  Recently lost weight loss w/o trying:  [x ]  Yes     If weight loss, how much?      [x]  unsure  Eating poorly due to decreased appetite?  [x]  yes  Assessment:  RD notes:  Pt reports lack of appetite since last Thursday with fluid intake but minimal solids. Does not endorse nausea or other GI issues as barriers to po. Denies special diet needs. Ate minimal amounts of a breakfast tray received this AM. Pt desires a bedtime snack but no interest expressed in a liquid nutritional but knows of their availability. RD explained "Catering to [employer]" menu program to pt and cited menu substitutions are permitted.   Pt says any wt loss he has had occurred b/w last Thursday and now. Denies pattern of wt loss prior.  Pt symptoms PTA: minimal po x 5 days, SOB, productive cough of yellow sputum, some diarrhea.  Below is connect care weight record for this patient. Based on connect care functionality, RD cannot know if weights are actual stated, or estimated. Weights suggest potential of 10 lb weight loss since 03-16-14 for a potential loss of 5.56% UBW suggestive of severe weight loss.  Vitals 05/15/2014 05/14/2014 03/16/2014   Systolic 166   122   Diastolic 89   75   Pulse 76   75   Temp 98.9   97.5   Respirations 20   16   Weight   170 180   Height   5\' 11"  5\' 11"    BMI   23.72 25.12   SpO2 94   99     Pt /Nutrition Hx:   64 yo M admitted with CAP  Pertinent medications:  K-Dur  Anthropometrics:              Ht = 5'11", wt = 77.111 kgs (weight source: *unknown) BMI of 23.8 c/w acceptable weight designation for patient.  Macronutrient Needs:              EER: [id number] calories per day based on 25-30 calories per kg recorded pt weight. (99 % IBW)              EPR: 93-108 grams per day based on 1.2-1.4 grams of protein per kg of recorded pt wt  (GFR >60 ml/min  )  Lab Results   Component Value Date/Time      POTASSIUM 3.3 05/15/2014 05:46 AM     SODIUM 137 05/15/2014 05:46 AM     MAGNESIUM 2.2 05/15/2014 05:46 AM     GLUCOSE 127 05/15/2014 05:46 AM     CREATININE 0.70 05/15/2014 05:46 AM     HEMOGLOBIN A1C 5.9 05/15/2014 05:46 AM     POC Glucoses:  139 mg/dl  Intake/Comparative Standards:   No meal intake data available.    Nutrition Diagnosis:   Inadequate oral intake related to decreased ability to consume sufficient energy as evidenced by patient report of lack of appetite and potential severe weight loss in a month of 5.56%.  Intervention:        Plan:   >   Meals and snacks: NPO, add HS snack per pt request once diet is ordered.  >   Feeding Assistance:  Menu selection assistance per protocol.  >   Nutrition Supplement Therapy: declines .   >   May be beneficial to offer patient  daily multivitamin.  >  * May be beneficial to weigh this patient if weight on EMR was not obtained here post admit.  Gwen PoundsSusan Hubbard RD,LD  [phone]

## 2014-05-15 NOTE — Progress Notes (Signed)
After repeated attempts x4 from 4 different nurses, attempts at IV access remain unsuccesful.

## 2014-05-15 NOTE — Progress Notes (Signed)
Patient sitting in bed.  Assisted with lunch tray.  Call light within reach.  Patient denies any needs at this time.  Will call for assistance.  Will continue to monitor.

## 2014-05-15 NOTE — Progress Notes (Signed)
Dr. Nelle DonAwan returned page. Notified of inability to regain IV access and requested to change to po antibiotics. Telephone orders received to discontinue IV Levaquin and IV Vancomycin and new orders received for Zyvox 600 mg po every 12 hours, and for Levaquin 750 mg po daily, and to order a PICC for tomorrow.  Orders read back for clarification.

## 2014-05-15 NOTE — Progress Notes (Signed)
Bedside report given to Darel HongJudy, Charity fundraiserN.  Engineering in to change bed out due to sparks coming from bed (unoccupied).  Patient IV infiltrated.  Levaquin spiked, but canceled on MAR.  Pharmacy to reschedule.  Vanc moved to night shift.  Unable to obtain IV access at this time.  Night shift will attempt.

## 2014-05-15 NOTE — Progress Notes (Signed)
Problem: Breathing Pattern - Ineffective  Goal: *Absence of hypoxia  Outcome: Progressing Towards Goal  97% on RA

## 2014-05-15 NOTE — Progress Notes (Addendum)
Admit with Pneumonia  Self pay, May not be eligable for Medicare- turned 64 this month.  Referred to Georg Ruddlehonda Lindsey with [facility].  Maple Mirzaonna McKinney from Financial assistance has seen patient.     Pneumonia education given. Poor appetite Nutrition has seen.     Pt lives with brother.  He does drive and is very active.      Jerrel IvoryJulie Davarion Cuffee, RN- [facility], BSN  COPD Care Transition Facilitator  [phone]

## 2014-05-15 NOTE — Progress Notes (Signed)
Engineering replaced bed.  Bed operational.  Pt back to bed.  IV attempt x2 unsuccesful.

## 2014-05-15 NOTE — Progress Notes (Addendum)
Gene King  Admission Date: 05/14/2014             Daily Progress Note: 05/15/2014    The patient's chart is reviewed and the patient is discussed with the staff.    64 y.o. AAM admitted to the floor from the ED with CAP.?? Initial complaints focused some abdominal pain over the last few days and he says he has not eaten anything in 5 days.?? He became short of breath and started coughing.?? Abdominal series was obtained,?? and he was noted to have in filtrates on the right.??     Subjective:     Sitting up in bed and states is feeling some better.  Occasional productive cough with yellow sputum.    Had diarrhea at home but none now.    Current Facility-Administered Medications   Medication Dose Route Frequency   ??? sodium chloride (NS) flush 5-10 mL  5-10 mL IntraVENous Q8H   ??? sodium chloride (NS) flush 5-10 mL  5-10 mL IntraVENous PRN   ??? albuterol (PROVENTIL VENTOLIN) nebulizer solution 2.5 mg  2.5 mg Nebulization Q6H RT   ??? guaiFENesin SR (MUCINEX) tablet 1,200 mg  1,200 mg Oral Q12H   ??? enoxaparin (LOVENOX) injection 40 mg  40 mg SubCUTAneous Q24H   ??? levofloxacin (LEVAQUIN) 750 mg in D5W IVPB  750 mg IntraVENous Q24H   ??? 0.9% sodium chloride infusion  100 mL/hr IntraVENous CONTINUOUS   ??? pneumococcal 23-valent (PNEUMOVAX 23) injection 0.5 mL  0.5 mL IntraMUSCular PRIOR TO DISCHARGE       Review of Systems  Constitutional: negative for fever, chills, sweats  Cardiovascular: negative for chest pain, palpitations, syncope, edema  Gastrointestinal:  negative for dysphagia, reflux, vomiting, diarrhea, abdominal pain, or melena  Neurologic:  negative for focal weakness, numbness, headache    Objective:     Filed Vitals:    05/14/14 2358 05/15/14 0124 05/15/14 0335 05/15/14 0730   BP: 171/83  162/87    Pulse: 68  67    Temp: 98.4 ??F (36.9 ??C)  97.8 ??F (36.6 ??C)    Resp: 18  18    Height:       Weight:       SpO2: 95% 98% 98% 97%     Intake and Output:           Physical Exam:    Constitution:  the patient is well developed and in no acute distress, room air, sat 98%  EENMT:  Sclera clear, pupils equal, oral mucosa moist  Respiratory: few anterior and posterior crackles, no wheezing  Cardiovascular:  RRR without M,G,R  Gastrointestinal: soft and non-tender; with positive bowel sounds.  Musculoskeletal: warm without cyanosis. There is no lower leg edema.  Skin:  no jaundice or rashes, no wounds   Neurologic: no gross neuro deficits     Psychiatric:  alert and oriented x 3    CHEST XRAY: None today      LAB  Recent Labs      05/15/14   0524  05/14/14   2155   GLUCPOC  139*  109*      Recent Labs      05/14/14   1625   WBC  18.1*   HGB  14.9   HCT  43.1   PLT  276     Recent Labs      05/15/14   0546  05/14/14   1625   NA  137  135*   K  3.3*  3.2*   CL  105  98   CO2  23  27   GLU  127*  127*   BUN  22  27*   CREA  0.70*  1.02   MG  2.2   --    CA  8.4  9.5   ALB   --   3.2   TBILI   --   1.5*   ALT   --   63   SGOT   --   60*   BNPP   --   26     No results for input(s): PH, PCO2, PO2, HCO3 in the last 72 hours.  Recent Labs      05/14/14   1910   LAC  1.9         Assessment:  (Medical Decision Making)     Hospital Problems  Date Reviewed: 05/14/2014          Codes Class Noted POA    * (Principal)CAP (community acquired pneumonia) ICD-10-CM: J18.9  ICD-9-CM: 486  05/14/2014 Yes    Continue current    Leukocytosis ICD-10-CM: D72.829  ICD-9-CM: 288.60  05/14/2014 Yes    Follow in AM    Diabetes mellitus type 2, controlled (HCC) (Chronic) ICD-10-CM: E11.9  ICD-9-CM: 250.00  05/14/2014 Yes    Chronic--ranges 109-139          Plan:  (Medical Decision Making)     --NS 15800ml/hr--stop  --Albuterol, Mucinex  --Levaquin day 2  --Blood cultures:  Pending  --K+ 3.3--supplement  --Follow up CBC in AM    More than 50% of the time documented was spent in face-to-face contact with the patient and in the care of the patient on the floor/unit where the patient is located.    Bobbye CharlestonSANDRA W LOWE, NP  Lungs:  Rhonchi R base   Heart:  RRR with no Murmur/Rubs/Gallops    Additional Comments:  Continue antibx    I have spoken with and examined the patient. I agree with the above assessment and plan as documented.    Allie BossierHARLES V MULLEN JR, MD

## 2014-05-15 NOTE — Progress Notes (Signed)
Gene King attempted to gain IV access, unsuccesfully.  Will ask Gene King from the Unit to try. Paged [facility] to see if we can switch to po antibiotics.

## 2014-05-16 LAB — METABOLIC PANEL, BASIC
Anion gap: 9 mmol/L (ref 7–16)
BUN: 11 MG/DL (ref 8–23)
CO2: 24 mmol/L (ref 21–32)
Calcium: 8.6 MG/DL (ref 8.3–10.4)
Chloride: 102 mmol/L (ref 98–107)
Creatinine: 0.83 MG/DL (ref 0.8–1.5)
GFR est AA: >60 mL/min/{1.73_m2} (ref >60)
GFR est non-AA: >60 mL/min/{1.73_m2} (ref >60)
Glucose: 156 mg/dL — ABNORMAL HIGH (ref 65–100)
Potassium: 3.4 mmol/L — ABNORMAL LOW (ref 3.5–5.1)
Sodium: 135 mmol/L — ABNORMAL LOW (ref 136–145)

## 2014-05-16 LAB — CBC WITH AUTOMATED DIFF
ABS. BASOPHILS: 0 10*3/uL (ref 0.0–0.2)
ABS. EOSINOPHILS: 0.1 10*3/uL (ref 0.0–0.8)
ABS. IMM. GRANS.: 0.2 10*3/uL (ref 0.0–0.5)
ABS. LYMPHOCYTES: 2.1 10*3/uL (ref 0.5–4.6)
ABS. MONOCYTES: 0.9 10*3/uL (ref 0.1–1.3)
ABS. NEUTROPHILS: 9.7 10*3/uL — ABNORMAL HIGH (ref 1.7–8.2)
BASOPHILS: 0 % (ref 0.0–2.0)
EOSINOPHILS: 1 % (ref 0.5–7.8)
HCT: 32.7 % — ABNORMAL LOW (ref 41.1–50.3)
HGB: 10.8 g/dL — ABNORMAL LOW (ref 13.6–17.2)
IMMATURE GRANULOCYTES: 1.4 % (ref 0.0–5.0)
LYMPHOCYTES: 16 % (ref 13–44)
MCH: 26.5 PG (ref 26.1–32.9)
MCHC: 33 g/dL (ref 31.4–35.0)
MCV: 80.1 FL (ref 79.6–97.8)
MONOCYTES: 7 % (ref 4.0–12.0)
MPV: 10.7 FL — ABNORMAL LOW (ref 10.8–14.1)
NEUTROPHILS: 75 % (ref 43–78)
PLATELET: 262 10*3/uL (ref 150–450)
RBC: 4.08 M/uL — ABNORMAL LOW (ref 4.23–5.67)
RDW: 13 % (ref 11.9–14.6)
WBC: 12.9 10*3/uL — ABNORMAL HIGH (ref 4.3–11.1)

## 2014-05-16 LAB — GLUCOSE, POC
Glucose (POC): 159 mg/dL — ABNORMAL HIGH (ref 65–100)
Glucose (POC): 140 mg/dL — ABNORMAL HIGH (ref 65–100)

## 2014-05-16 MED ORDER — LEVOFLOXACIN 250 MG TAB
250 mg | ORAL | Status: DC
Start: 2014-05-16 — End: 2014-05-16
  Administered 2014-05-16: 03:00:00 via ORAL

## 2014-05-16 MED ORDER — LEVOFLOXACIN 750 MG TAB
750 mg | ORAL_TABLET | ORAL | Status: AC
Start: 2014-05-16 — End: 2014-05-21

## 2014-05-16 MED ORDER — ALBUTEROL SULFATE HFA 90 MCG/ACTUATION AEROSOL INHALER
90 mcg/actuation | Freq: Four times a day (QID) | RESPIRATORY_TRACT | Status: AC
Start: 2014-05-16 — End: ?

## 2014-05-16 MED ORDER — GUAIFENESIN SR 600 MG TAB
600 mg | ORAL_TABLET | Freq: Two times a day (BID) | ORAL | Status: DC
Start: 2014-05-16 — End: 2014-07-25

## 2014-05-16 MED ORDER — LINEZOLID 600 MG TAB
600 mg | Freq: Two times a day (BID) | ORAL | Status: DC
Start: 2014-05-16 — End: 2014-05-16
  Administered 2014-05-16 (×2): via ORAL

## 2014-05-16 MED FILL — ZYVOX 600 MG TABLET: 600 mg | ORAL | Qty: 1

## 2014-05-16 MED FILL — ALBUTEROL SULFATE 0.083 % (0.83 MG/ML) SOLN FOR INHALATION: 2.5 mg /3 mL (0.083 %) | RESPIRATORY_TRACT | Qty: 1

## 2014-05-16 MED FILL — LOVENOX 40 MG/0.4 ML SUBCUTANEOUS SYRINGE: 40 mg/0.4 mL | SUBCUTANEOUS | Qty: 0.4

## 2014-05-16 MED FILL — LEVOFLOXACIN IN D5W 750 MG/150 ML IV PIGGY BACK: 750 mg/150 mL | INTRAVENOUS | Qty: 150

## 2014-05-16 MED FILL — PNEUMOVAX-23 25 MCG/0.5 ML INJECTION SOLUTION: 25 mcg/0.5 mL | INTRAMUSCULAR | Qty: 0.5

## 2014-05-16 MED FILL — GUAIFENESIN SR 600 MG TAB: 600 mg | ORAL | Qty: 2

## 2014-05-16 MED FILL — LEVOFLOXACIN 500 MG TAB: 500 mg | ORAL | Qty: 1

## 2014-05-16 NOTE — Progress Notes (Signed)
Discharge instructions, prescriptions(levaquin, albuterol, and mucinex) and prescription voucher for [facility], and medication side effects sheet provided and explained to the pt.  IV already removed by primary RN.Opportunity for questions provided.  [place] [facility] [place] and then will call when ready to leave.

## 2014-05-16 NOTE — Progress Notes (Signed)
Patient in bed resting with no complaints at this time.  Patient is alert and orientated with no distress noted.  Patient has no IV access at this time.  Order for PICC noted.  Respirations even and unlabored with heart rate regular.  Patient able to ambulate independently without assistance.  Bed in low locked position with call light within reach.  Patient instructed to call if assistance is needed.  Will continue to monitor.  Family at bedside for support.

## 2014-05-16 NOTE — Progress Notes (Signed)
Patient discharging home today. Medication voucher provided for [facility]. Patient has been referred to the [facility] for possible follow-up care. Dionne from [place] has met with him regarding possible qualification for disability benefits. No other discharge needs anticipated. Case Management will remain available to assist as needed.

## 2014-05-16 NOTE — Discharge Summary (Addendum)
Discharge Note    Gene BlossomJames Edward Righetti  Admission date:  05/14/2014  Discharge date:  05/16/2014      Admitting Diagnosis:  CAP (community acquired pneumonia)    Discharge Diagnoses:   Hospital Problems  Date Reviewed: 05/14/2014          Codes Class Noted POA    * (Principal)CAP (community acquired pneumonia) ICD-10-CM: J18.9  ICD-9-CM: 486  05/14/2014 Yes        Leukocytosis ICD-10-CM: Z61.09672.829  ICD-9-CM: 288.60  05/14/2014 Yes        Diabetes mellitus type 2, controlled (HCC) (Chronic) ICD-10-CM: E11.9  ICD-9-CM: 250.00  05/14/2014 Yes              Consultants:  None    Studies/Procedures:  Abdominal series:  Patchy airspace opacity in the right mid and lower lung. Pneumonia  could be considered. The bowel gas pattern is nonobstructive.    Condition on Discharge:  stable    Disposition:  home      Presenting Illness:  64 y.o. AAM admitted to the floor from the ED with CAP.?? Initial complaints focused some abdominal pain over the last few days and he says he has not eaten anything in 5 days.?? He became short of breath and started coughing.?? Abdominal series was obtained,?? and he was noted to have in filtrates on the right.??     Hospital course:  Was admitted to the floor and continued on antibiotics, IVFs and Albuterol nebs.  He used flutter valve to mobilize secretions and had yellow productive cough.  Abdominal discomfort eased and diarrhea resolved.  Blood cultures resulted with 1/2 with staph species and Zyvox added.  Was felt to be a contaminate and will follow results.  He is now ready for discharge to home and will follow up in the office with CXR in 2 weeks.  Has been encouraged to continue with flutter valve.  Will continue Levaquin for antibiotics course.      Physical Exam:   Constitution:?? the patient is well developed and in no acute distress, room air, sat 98%  EENMT:?? Sclera clear, pupils equal, oral mucosa moist  Respiratory: few anterior and posterior crackles, no wheezing   Cardiovascular:?? RRR without M,G,R  Gastrointestinal: soft and non-tender; with positive bowel sounds.  Musculoskeletal: warm without cyanosis. There is no lower leg edema.  Skin:?? no jaundice or rashes, no wounds ??  Neurologic: no gross neuro deficits????   Psychiatric:?? alert and oriented x 3      LAB  Recent Labs      05/14/14   1625   WBC  18.1*   HGB  14.9   HCT  43.1   PLT  276     Recent Labs      05/15/14   0546  05/14/14   1625   NA  137  135*   K  3.3*  3.2*   CL  105  98   CO2  23  27   BUN  22  27*   CREA  0.70*  1.02   MG  2.2   --    ALB   --   3.2   BNPP   --   26     No results for input(s): PH, PCO2, PO2, HCO3 in the last 72 hours.    Discharge Medications:   Current Discharge Medication List      START taking these medications    Details   albuterol (PROVENTIL HFA, VENTOLIN HFA,  PROAIR HFA) 90 mcg/actuation inhaler Take 2 Puffs by inhalation four (4) times daily.  Qty: 1 Inhaler, Refills: 6      guaiFENesin SR (MUCINEX) 600 mg SR tablet Take 2 Tabs by mouth every twelve (12) hours.  Qty: 120 Tab, Refills: 0      levofloxacin (LEVAQUIN) 750 mg tablet Take 1 Tab by mouth every twenty-four (24) hours for 5 days.  Qty: 5 Tab, Refills: 0         CONTINUE these medications which have NOT CHANGED    Details   cyclobenzaprine (FLEXERIL) 10 mg tablet Refills: 0               Followup/Outpt Studies:  --Follow up with transitional [facility] with [facility] in 2 weeks with CXR and spirometry.  --Continue Levaquin course for 5 more days.  --Will follow blood cultures.  --Continue Mucinex and flutter valve.  --Total discharge greater than 30 minutes in duration.    More than 50% of the time documented was spent in face-to-face contact with the patient and in the care of the patient on the floor/unit where the patient is located.    Bobbye CharlestonSANDRA W LOWE, NP          Lungs:  clear  Heart:  RRR with no Murmur/Rubs/Gallops    Additional Comments:  Going home, positive BC with coag neg staph likely  contaminant     I have spoken with and examined the patient. I agree with the above assessment and plan as documented.    Anitra LauthKatarina Ronesha Heenan, MD

## 2014-05-17 NOTE — Progress Notes (Signed)
[facility] by Jerrel IvoryJulie Jackson, RN. HOP screenings and Healthy Checkup application completed on May 16, 2014. Appoint[facility] on May 22, 2014 at 1 pm.

## 2014-05-17 NOTE — Telephone Encounter (Signed)
Left msg that call is to confirm d/c and health; encouraged to call for questions or problems and f/u call regarding appt will be made to this number.

## 2014-05-18 LAB — CULTURE, BLOOD: GRAM STAIN: POSITIVE

## 2014-05-19 LAB — CULTURE, BLOOD: Culture result:: NO GROWTH

## 2014-05-23 ENCOUNTER — Encounter

## 2014-05-24 ENCOUNTER — Encounter: Attending: Physician Assistant

## 2014-06-24 ENCOUNTER — Encounter

## 2014-06-25 ENCOUNTER — Ambulatory Visit: Payer: Self-pay

## 2014-06-25 ENCOUNTER — Encounter: Attending: Physician Assistant

## 2014-07-23 ENCOUNTER — Inpatient Hospital Stay
Admit: 2014-07-23 | Discharge: 2014-07-24 | Disposition: A | Discharge status: Home / Self Care | Payer: MEDICARE | Attending: Family Medicine

## 2014-07-23 ENCOUNTER — Emergency Department: Payer: MEDICARE

## 2014-07-23 DIAGNOSIS — J168 Pneumonia due to other specified infectious organisms: Secondary | ICD-10-CM

## 2014-07-23 LAB — METABOLIC PANEL, COMPREHENSIVE
A-G Ratio: 0.5 — ABNORMAL LOW (ref 1.2–3.5)
ALT (SGPT): 39 U/L (ref 12–65)
AST (SGOT): 52 U/L — ABNORMAL HIGH (ref 15–37)
Albumin: 3.2 g/dL (ref 3.2–4.6)
Alk. phosphatase: 150 U/L — ABNORMAL HIGH (ref 50–136)
Anion gap: 8 mmol/L (ref 7–16)
BUN: 15 MG/DL (ref 8–23)
Bilirubin, total: 1.5 MG/DL — ABNORMAL HIGH (ref 0.2–1.1)
CO2: 28 mmol/L (ref 21–32)
Calcium: 9 MG/DL (ref 8.3–10.4)
Chloride: 94 mmol/L — ABNORMAL LOW (ref 98–107)
Creatinine: 1.14 MG/DL (ref 0.8–1.5)
GFR est AA: >60 mL/min/{1.73_m2} (ref >60)
GFR est non-AA: >60 mL/min/{1.73_m2} (ref >60)
Globulin: 5.9 g/dL — ABNORMAL HIGH (ref 2.3–3.5)
Glucose: 209 mg/dL — ABNORMAL HIGH (ref 65–100)
Potassium: 3.1 mmol/L — ABNORMAL LOW (ref 3.5–5.1)
Protein, total: 9.1 g/dL — ABNORMAL HIGH (ref 6.3–8.2)
Sodium: 130 mmol/L — ABNORMAL LOW (ref 136–145)

## 2014-07-23 LAB — CBC WITH AUTOMATED DIFF
ABS. BASOPHILS: 0 10*3/uL (ref 0.0–0.2)
ABS. EOSINOPHILS: 0 10*3/uL (ref 0.0–0.8)
ABS. IMM. GRANS.: 0.1 10*3/uL (ref 0.0–0.5)
ABS. LYMPHOCYTES: 1.1 10*3/uL (ref 0.5–4.6)
ABS. MONOCYTES: 0.7 10*3/uL (ref 0.1–1.3)
ABS. NEUTROPHILS: 12.9 10*3/uL — ABNORMAL HIGH (ref 1.7–8.2)
BASOPHILS: 0 % (ref 0.0–2.0)
EOSINOPHILS: 0 % — ABNORMAL LOW (ref 0.5–7.8)
HCT: 38.4 % — ABNORMAL LOW (ref 41.1–50.3)
HGB: 13.1 g/dL — ABNORMAL LOW (ref 13.6–17.2)
IMMATURE GRANULOCYTES: 0.9 % (ref 0.0–5.0)
LYMPHOCYTES: 7 % — ABNORMAL LOW (ref 13–44)
MCH: 27.5 PG (ref 26.1–32.9)
MCHC: 34.1 g/dL (ref 31.4–35.0)
MCV: 80.5 FL (ref 79.6–97.8)
MONOCYTES: 5 % (ref 4.0–12.0)
MPV: 9.8 FL — ABNORMAL LOW (ref 10.8–14.1)
NEUTROPHILS: 87 % — ABNORMAL HIGH (ref 43–78)
PLATELET: 313 10*3/uL (ref 150–450)
RBC: 4.77 M/uL (ref 4.23–5.67)
RDW: 13.4 % (ref 11.9–14.6)
WBC: 14.9 10*3/uL — ABNORMAL HIGH (ref 4.3–11.1)

## 2014-07-23 LAB — LACTIC ACID - RESPIRATORY: Lactic acid: 3.5 MMOL/L — ABNORMAL HIGH (ref 0.4–2.0)

## 2014-07-23 NOTE — ED Notes (Addendum)
Reports just left [facility].  Reports found pneumonia on his chest x-ray.  Reports right foot is numb x 2-3 days.  Family has CD of chest x-ray from [facility].

## 2014-07-23 NOTE — ED Provider Notes (Addendum)
HPI Comments: 64 year old black male to [facility] today because of a cough congestion and some shortness of breath.  They did chest x-ray did show a right lower lobe pneumonia and he was sent here.  He was admitted 2 months ago for a pneumonia.  He has insulin-dependent diabetes.  He is a smoker.       History reviewed. No pertinent past medical history.    Past Surgical History:   Procedure Laterality Date   ??? Hx appendectomy           Family History:   Problem Relation Age of Onset   ??? Other Father      Deceased   ??? Other Mother      In assisted living       History     Social History   ??? Marital Status: SINGLE     Spouse Name: N/A   ??? Number of Children: N/A   ??? Years of Education: N/A     Occupational History   ??? Landscaper      Retired     Social History Main Topics   ??? Smoking status: Current Every Day Smoker -- 0.25 packs/day for 40 years     Types: Cigarettes   ??? Smokeless tobacco: Never Used   ??? Alcohol Use: 6.0 oz/week     10 Standard drinks or equivalent per week   ??? Drug Use: No   ??? Sexual Activity: Not on file     Other Topics Concern   ??? Not on file     Social History Narrative    There is no known exposure to TB.  There is no significant environmental or industrial exposure.             ALLERGIES: Review of patient's allergies indicates no known allergies.    Review of Systems   Constitutional: Negative for fever and fatigue.   HENT: Negative for congestion and sinus pressure.    Respiratory: Positive for shortness of breath. Negative for cough and chest tightness.    Cardiovascular: Negative for chest pain and leg swelling.   Gastrointestinal: Negative for abdominal pain.   Genitourinary: Negative for dysuria, urgency, frequency and flank pain.   Musculoskeletal: Negative for back pain, joint swelling, neck pain and neck stiffness.   Skin: Negative for rash.   Neurological: Negative for headaches.   Psychiatric/Behavioral: Negative for confusion and agitation.    All other systems reviewed and are negative.      Filed Vitals:    07/23/14 1802   BP: 154/83   Pulse: 99   Temp: 98.1 ??F (36.7 ??C)   Resp: 18   Height: 5\' 11"  (1.803 m)   Weight: 68.04 kg (150 lb)   SpO2: 98%            Physical Exam   Constitutional: He is oriented to person, place, and time. He appears well-developed and well-nourished. No distress.   HENT:   Head: Normocephalic and atraumatic.   Right Ear: External ear normal.   Left Ear: External ear normal.   Nose: Nose normal.   Mouth/Throat: Oropharynx is clear and moist. No oropharyngeal exudate.   Eyes: Conjunctivae and EOM are normal. Pupils are equal, round, and reactive to light. Right eye exhibits no discharge. Left eye exhibits no discharge.   Neck: Normal range of motion. Neck supple. No thyromegaly present.   Cardiovascular: Normal rate, regular rhythm, normal heart sounds and intact distal pulses.  Exam reveals no gallop and  no friction rub.    No murmur heard.  Pulmonary/Chest: Effort normal. No respiratory distress. He has rales.   His Rales in the right lung base       Abdominal: Soft. Bowel sounds are normal. There is no tenderness.   Musculoskeletal: Normal range of motion. He exhibits no tenderness.   Neurological: He is alert and oriented to person, place, and time. He displays normal reflexes. No cranial nerve deficit. He exhibits normal muscle tone. Coordination normal.   Skin: Skin is warm. No rash noted.   Psychiatric: He has a normal mood and affect. His behavior is normal.   Nursing note and vitals reviewed.       MDM  Number of Diagnoses or Management Options  Pneumonia of right lower lobe due to infectious organism:   Diagnosis management comments: I discussed the case with Dr. Suzie Portela was on for pulmonary.  Aware of the chest x-ray and lab work.  His O2 sat was 98% on room air and sitting in his bed in the room is stays in the mid to high 90s.  Not nauseated has no nausea or vomiting.  She recommended that we  give her adequate antibiotics here in the emergency room initially and sent him home on oral antibiotics and she would make sure that he is followed up in the clinic for repeat chest x-rays and close follow-up.  07/24/14 I received a call from [facility] today saying that one of the aerobic cultures was gravida M positive cocci in clusters.  Discussed this case with the intensivist for pulmonary Dr. Tamsen Roers and he wanted to know if any of the Ultram and other cultures grew are positive and they were not.  This most likely is contamination.  I called the mitral lab and had them check all the other cultures that were done and none of them are positive except for this one.  I also called [facility] there are 2 phone numbers for the family one of them is no longer in use and the other one just rang and I left a message on their answering machine to call back.LAB       Amount and/or Complexity of Data Reviewed  Clinical lab tests: ordered and reviewed  Tests in the radiology section of CPT??: ordered and reviewed    Risk of Complications, Morbidity, and/or Mortality  Presenting problems: moderate  Diagnostic procedures: moderate  Management options: moderate        Procedures

## 2014-07-23 NOTE — ED Notes (Signed)
I have reviewed medications, follow up provider options, and discharge instructions with the patient. The patient verbalized understanding. Copy of discharge information given to patient upon discharge. Prescription(s) given to patient. Patient discharged ambulatory in no distress.

## 2014-07-24 LAB — PROCALCITONIN: Procalcitonin: 0.4 ng/mL

## 2014-07-24 MED ORDER — ADV ADDAPTOR
2 gram | Status: DC
Start: 2014-07-24 — End: 2014-07-24
  Administered 2014-07-24: 01:00:00 via INTRAVENOUS

## 2014-07-24 MED ORDER — SODIUM CHLORIDE 0.9% BOLUS IV
0.9 % | Freq: Once | INTRAVENOUS | Status: DC
Start: 2014-07-24 — End: 2014-07-23

## 2014-07-24 MED ORDER — LEVOFLOXACIN 500 MG TAB
500 mg | ORAL_TABLET | Freq: Every day | ORAL | Status: AC
Start: 2014-07-24 — End: 2014-08-02

## 2014-07-24 MED ORDER — AZITHROMYCIN 500 MG IV SOLUTION
500 mg | INTRAVENOUS | Status: DC
Start: 2014-07-24 — End: 2014-07-24
  Administered 2014-07-24: 02:00:00 via INTRAVENOUS

## 2014-07-24 MED FILL — CEFTRIAXONE 2 GRAM SOLUTION FOR INJECTION: 2 gram | INTRAMUSCULAR | Qty: 2

## 2014-07-24 MED FILL — AZITHROMYCIN 500 MG IV SOLUTION: 500 mg | INTRAVENOUS | Qty: 5

## 2014-07-25 ENCOUNTER — Ambulatory Visit: Payer: MEDICARE

## 2014-07-25 ENCOUNTER — Encounter: Attending: Pulmonary Disease

## 2014-07-25 ENCOUNTER — Ambulatory Visit: Admit: 2014-07-25 | Discharge: 2014-07-25 | Payer: MEDICARE | Attending: Internal Medicine

## 2014-07-25 ENCOUNTER — Inpatient Hospital Stay: Admit: 2014-07-25 | Payer: MEDICARE

## 2014-07-25 DIAGNOSIS — J189 Pneumonia, unspecified organism: Secondary | ICD-10-CM

## 2014-07-25 NOTE — Progress Notes (Signed)
is a small shortness of breath     Dr. Edsel PetrinJulie Zelia Yzaguirre, MD  [address]  [place], [place] [id number]  [phone]    Patient Name:  Gene BlossomJames Edward King  Date of Birth:  1950-02-09    Date of Visit: 07/25/2014    CHIEF COMPLAINT:    Chief Complaint   Patient presents with   ??? Follow-up     hospitalization for pneumonia in May, recent visit to ER for recurrent pneumonia       HISTORY OF PRESENT ILLNESS:    Gene King is a 64 year old gentleman, 10 pkyr smoking history quit in May 2016, history of diabetes, hypertension who was [facility] from March 3 until March 5th 2016 for community-acquired pneumonia right lower lobe and right upper lobe.  He was treated with Levaquin.  He was found to have staph epidermidis in his bloodstream in one out of 2 cultures, felt to be contaminant.  He was also treated for obstructive lung disease.     Two nights ago, [facility]. Gene King [facility] for recurrent cough, congestion and shortness of breath.  A right lower lobe pneumonia was again identified. This time imaging shows a more focal, denser infiltrate.  He has been having night sweats since his discharge in May.  Blood cultures in the emergency room resulted in one out of two blood cultures with staph species (pending id/susc).      Past Medical History   Diagnosis Date   ??? Diabetes mellitus (HCC)    ??? Hypertension        Past Surgical History   Procedure Laterality Date   ??? Hx appendectomy         No flowsheet data found.      History     Social History   ??? Marital Status: SINGLE     Spouse Name: N/A   ??? Number of Children: N/A   ??? Years of Education: N/A     Occupational History   ??? Landscaper      Retired     Social History Main Topics   ??? Smoking status: Current Every Day Smoker -- 0.25 packs/day for 40 years     Types: Cigarettes     Last Attempt to Quit: 07/22/2014   ??? Smokeless tobacco: Never Used       Comment: quit smoking when he was discharged from hospital   ??? Alcohol Use: 6.0 oz/week     10 Standard drinks or equivalent per week   ??? Drug Use: No   ??? Sexual Activity: Not on file     Other Topics Concern   ??? Not on file     Social History Narrative    There is no known exposure to TB.  There is no significant environmental or industrial exposure.           Family History   Problem Relation Age of Onset   ??? Other Father      Deceased   ??? Other Mother      In assisted living       No Known Allergies    Current Outpatient Prescriptions   Medication Sig   ??? naproxen sodium (NAPROSYN) 220 mg tablet Take 220 mg by mouth as needed.   ??? levofloxacin (LEVAQUIN) 500 mg tablet Take 1 Tab by mouth daily for 10 days.   ??? albuterol (PROVENTIL HFA, VENTOLIN HFA, PROAIR HFA) 90 mcg/actuation  inhaler Take 2 Puffs by inhalation four (4) times daily.     No current facility-administered medications for this visit.       REVIEW OF SYSTEMS:  CONSTITUTIONAL:  There is no history of fever, chills, ++night sweats, no weight loss, weight gain, persistent fatigue, or lethargy/hypersomnolence.  EYES:  Denies problems with eye pain, erythema, blurred vision, or visual field loss.  ENTM:  Denies history of tinnitus, epistaxis, sore throat, hoarseness, or dysphonia.  LYMPH:  Denies swollen glands.  CARDIAC:  No chest pain, pressure, discomfort, palpitations, orthopnea, murmurs, or edema.  GI:  No dysphagia, heartburn reflux, nausea/vomiting, diarrhea, abdominal pain, or bleeding.  ZO:XWRUEA history of dysuria, hematuria, polyuria, or decreased urine output.  MS:  No history of myalgias, arthralgias, bone pain, or muscle cramps.  SKIN:  No history of rashes, jaundice, cyanosis, nodules, or ulcers.  ENDO:  Negative for heat or cold intolerance.  No history of DM.  PSYCH:  Negative for anxiety, depression, insomnia, hallucinations.  NEURO:  There is no history of AMS, persistent headache, decreased level  of consciousness, seizures, or motor or sensory deficits.    PHYSICAL EXAM:    Visit Vitals   Item Reading   ??? BP 118/83 mmHg   ??? Pulse 79   ??? Temp(Src) 97.2 ??F (36.2 ??C) (Oral)   ??? Resp 20   ??? Ht  (1.803 m)   ??? Wt 148 lb 1.6 oz (67.178 kg)   ??? BMI 20.67 kg/m2   ??? SpO2 95%       General Appearance:  The patient is pleasant and in no respiratory distress.  HEENT: PERRLA.  Conjunctivae unremarkable.  Nasal mucosa is without epistaxis, exudate, or polyps.  Gums and dentition are unremarkable.    Neck/Lymphatic:  Symmetrical with no elevation of jugular venous pulsation.  Trachea midline. No thyroid enlargement.  No cervical adenopathy.  Lungs:  Normal respiratory effort with symmetrical lung expansion.   Breath sounds with rhocnhi and wheeze on R  Heart:RRR no MRG  Abdomen:  Soft and non-tender.  No hepatosplenomegaly.  Bowel sounds are normal.    Extremity:  No edema, clubbing or cyanosis    DIAGNOSTIC TESTS:    CXR 07/23/14:  Dense RLL infiltrate      CXR 05/14/14:    05/14/14 blood culture:      ASSESSMENT & PLAN:  (Medical Decision Making)      ICD-10-CM ICD-9-CM    1. CAP (community acquired pneumonia) J18.9 486 CT CHEST WO CONT   Continue 10 day course of levaquin.  Will f/u with CT to evaluate for possible lung abscess.  Also will obtain sputum culture.  May need bronchoscopy.   CULTURE, RESPIRATORY/SPUTUM/BRONCH W GRAM STAIN      CANCELED: AMB POC SPIROMETRY   2. Night sweats- concerning for alternate diagnosis explaining recurrent lung infiltrate- lung abscess or malignancy. R61 780.8 CT CHEST WO CONT   3. Positive blood culture-  Will retest blood cultures at follow-up visit.  Strange that both blood cultures would be 1/2 positive.  No murmurs or stigmata of endocarditis today. R78.81 790.7          Orders Placed This Encounter   ??? CULTURE, RESPIRATORY/SPUTUM/BRONCH W GRAM STAIN   ??? CT CHEST WO CONT        Harriett Rush, MD  Electronically signed

## 2014-07-26 LAB — CULTURE, BLOOD

## 2014-07-27 LAB — CULTURE, RESPIRATORY/SPUTUM/BRONCH W GRAM STAIN
Culture result:: NORMAL
GRAM STAIN: 0
GRAM STAIN: 3

## 2014-07-28 LAB — CULTURE, BLOOD: Culture result:: NO GROWTH

## 2014-08-09 ENCOUNTER — Encounter: Payer: MEDICARE | Attending: Internal Medicine

## 2014-08-13 ENCOUNTER — Inpatient Hospital Stay: Payer: MEDICARE | Attending: Internal Medicine

## 2014-08-15 ENCOUNTER — Inpatient Hospital Stay: Payer: MEDICARE | Attending: Internal Medicine

## 2014-08-22 ENCOUNTER — Inpatient Hospital Stay: Payer: MEDICARE | Attending: Internal Medicine

## 2014-08-28 ENCOUNTER — Encounter: Payer: MEDICARE | Attending: Internal Medicine

## 2016-02-21 IMAGING — CR DG ABDOMEN ACUTE W/ 1V CHEST
4 series · 4 of 4 positions shown · non-contrast
Comparison: None.

CLINICAL DATA: Nausea and vomiting for 4 days

EXAM:
ACUTE ABDOMEN SERIES (ABDOMEN 2 VIEW & CHEST 1 VIEW)

[w chest pa]
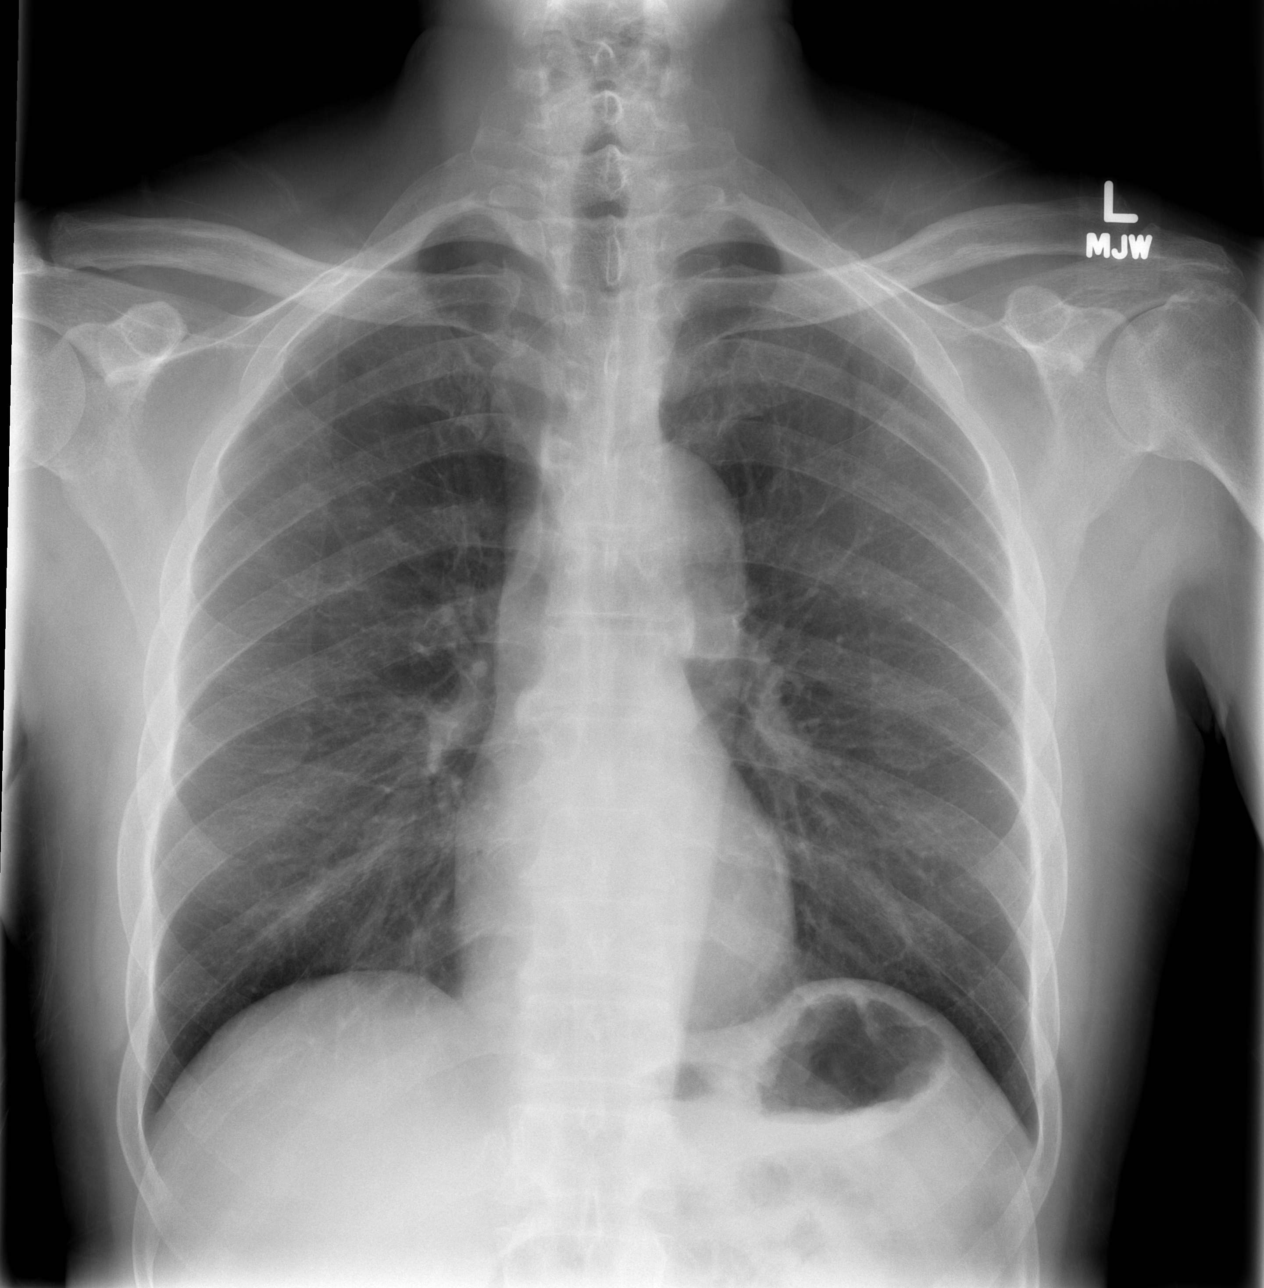

[w abdomen upright]
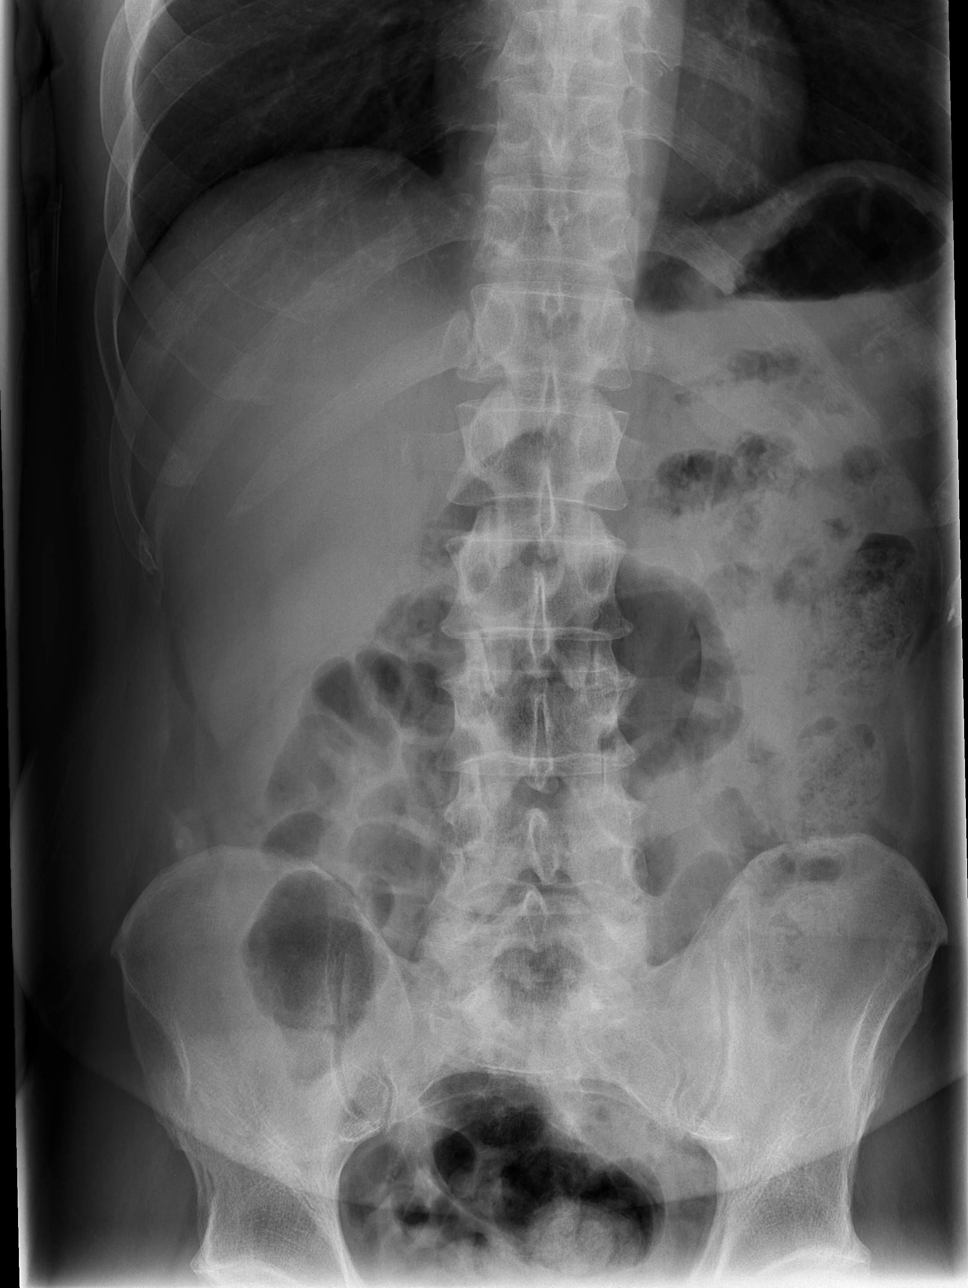

[t abdomen supine (1 of 2)]
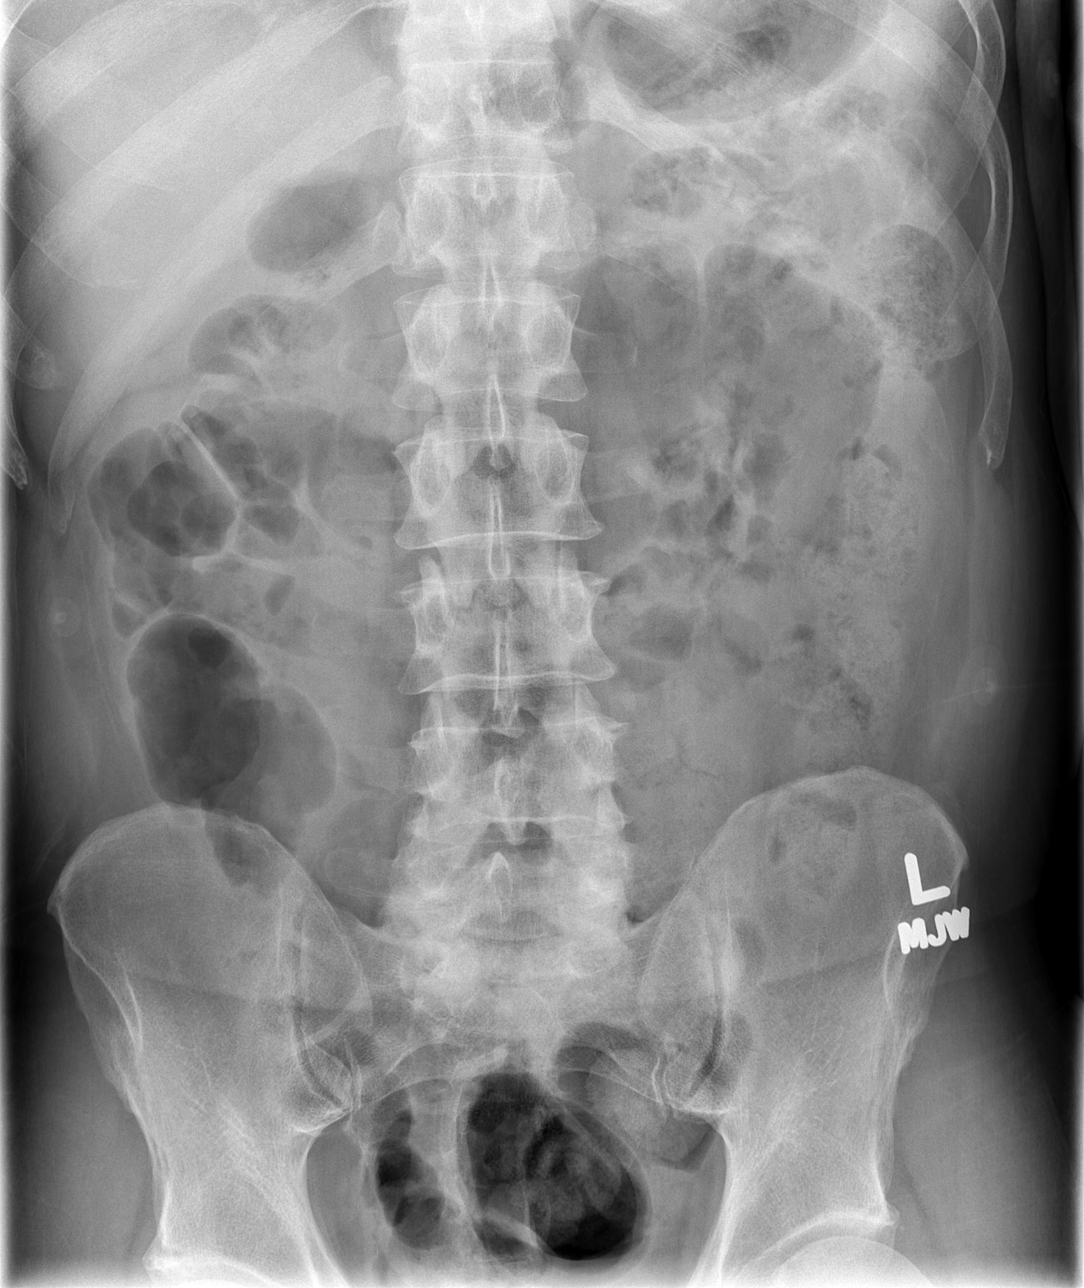

[t abdomen supine (2 of 2)]
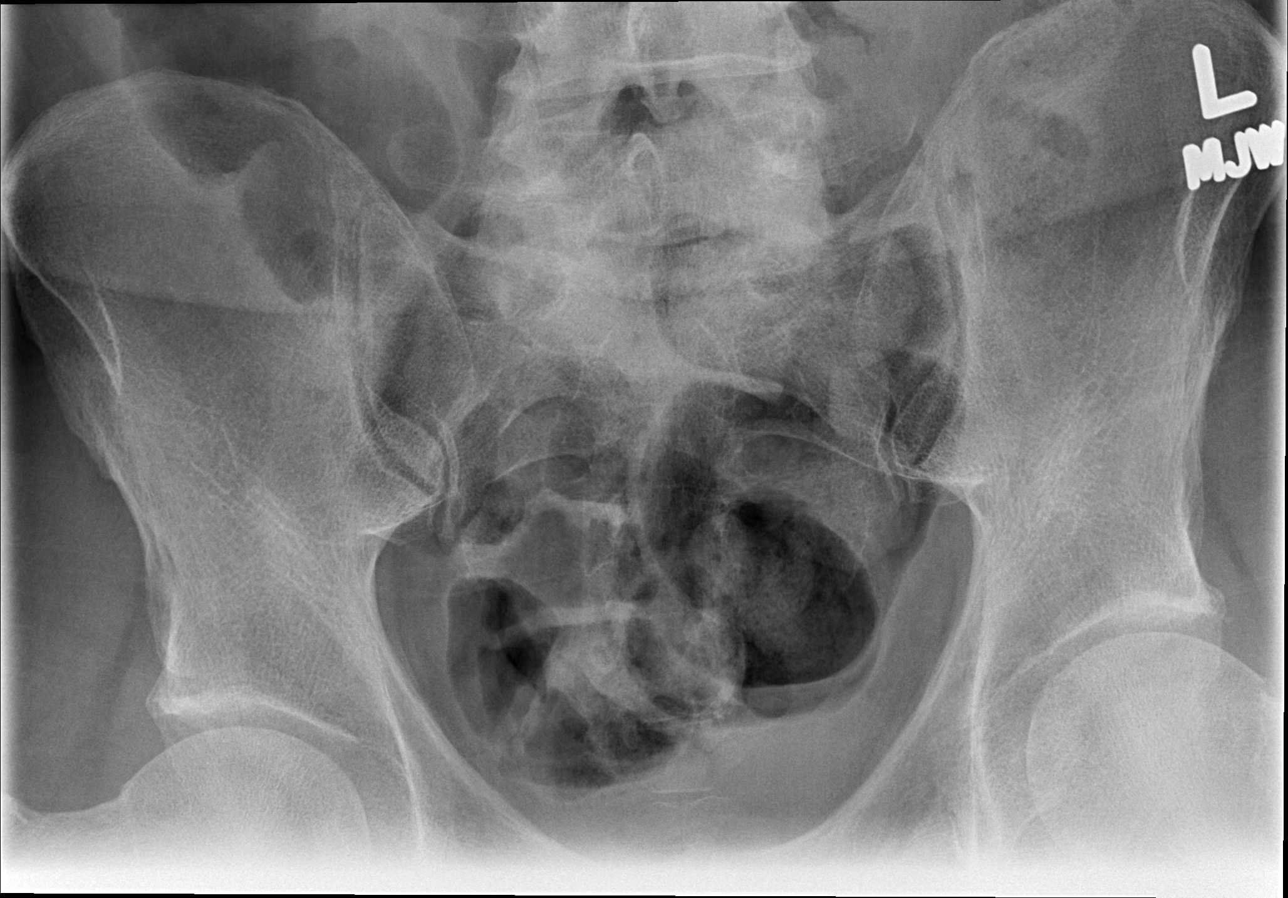

[4 of 4 positions shown; findings below may reference images not displayed]

FINDINGS: Cardiomediastinal silhouette is unremarkable. No acute infiltrate or
pleural effusion. No pulmonary edema. There is nonobstructive small
bowel gas pattern. Mild gaseous distension of the right colon and
transverse colon without colonic air-fluid levels. Mild colonic
ileus or colitis cannot be excluded. Clinical correlation is
necessary. No free abdominal air.
IMPRESSION: No acute disease within chest. Mild gaseous distension of the right
colon and transverse colon. Mild colonic ileus or colitis cannot be
excluded. Clinical correlation is necessary. No free abdominal air.

## 2021-05-15 ENCOUNTER — Emergency Department: Admit: 2021-05-15 | Payer: MEDICARE

## 2021-05-15 ENCOUNTER — Inpatient Hospital Stay
Admit: 2021-05-15 | Discharge: 2021-05-15 | Disposition: A | Discharge status: Home / Self Care | Payer: MEDICARE | Attending: Emergency Medicine

## 2021-05-15 DIAGNOSIS — S20212A Contusion of left front wall of thorax, initial encounter: Secondary | ICD-10-CM

## 2021-05-15 LAB — POCT URINALYSIS DIPSTICK
Bilirubin, Urine, POC: NEGATIVE
Glucose, UA POC: 100 mg/dL — AB
Ketones, Urine, POC: NEGATIVE mg/dL
Leukocyte Est, UA POC: NEGATIVE
Nitrite, Urine, POC: NEGATIVE
Protein, Urine, POC: 30 mg/dL — AB
Specific Gravity, Urine, POC: 1.025 — ABNORMAL HIGH (ref 1.001–1.023)
URINE UROBILINOGEN POC: 2 EU/dL — ABNORMAL HIGH (ref 0.2–1.0)
pH, Urine, POC: 6 (ref 5.0–9.0)

## 2021-05-15 LAB — COMPREHENSIVE METABOLIC PANEL
ALT: 68 U/L — ABNORMAL HIGH (ref 12–65)
AST: 152 U/L — ABNORMAL HIGH (ref 15–37)
Albumin/Globulin Ratio: 0.9 (ref 0.4–1.6)
Albumin: 3.7 g/dL (ref 3.2–4.6)
Alk Phosphatase: 112 U/L (ref 50–136)
Anion Gap: 1 mmol/L — ABNORMAL LOW (ref 2–11)
BUN: 20 MG/DL (ref 8–23)
CO2: 28 mmol/L (ref 21–32)
Calcium: 9.2 MG/DL (ref 8.3–10.4)
Chloride: 104 mmol/L (ref 101–110)
Creatinine: 1.1 MG/DL (ref 0.8–1.5)
Est, Glom Filt Rate: >60 mL/min/{1.73_m2} (ref >60)
Globulin: 4 g/dL (ref 2.8–4.5)
Glucose: 88 mg/dL (ref 65–100)
Potassium: 3.8 mmol/L (ref 3.5–5.1)
Sodium: 133 mmol/L (ref 133–143)
Total Bilirubin: 1 MG/DL (ref 0.2–1.1)
Total Protein: 7.7 g/dL (ref 6.3–8.2)

## 2021-05-15 LAB — CBC WITH AUTO DIFFERENTIAL
Absolute Immature Granulocyte: 0 10*3/uL (ref 0.0–0.5)
Basophils %: 0 % (ref 0.0–2.0)
Basophils Absolute: 0 10*3/uL (ref 0.0–0.2)
Eosinophils %: 1 % (ref 0.5–7.8)
Eosinophils Absolute: 0.1 10*3/uL (ref 0.0–0.8)
Hematocrit: 34.4 % — ABNORMAL LOW (ref 41.1–50.3)
Hemoglobin: 11.3 g/dL — ABNORMAL LOW (ref 13.6–17.2)
Immature Granulocytes: 0 % (ref 0.0–5.0)
Lymphocytes %: 13 % (ref 13–44)
Lymphocytes Absolute: 0.6 10*3/uL (ref 0.5–4.6)
MCH: 27 PG (ref 26.1–32.9)
MCHC: 32.8 g/dL (ref 31.4–35.0)
MCV: 82.3 FL (ref 82–102)
MPV: 9.7 FL (ref 9.4–12.3)
Monocytes %: 13 % — ABNORMAL HIGH (ref 4.0–12.0)
Monocytes Absolute: 0.6 10*3/uL (ref 0.1–1.3)
Neutrophils %: 73 % (ref 43–78)
Neutrophils Absolute: 3.7 10*3/uL (ref 1.7–8.2)
Platelets: 198 10*3/uL (ref 150–450)
RBC: 4.18 M/uL — ABNORMAL LOW (ref 4.23–5.6)
RDW: 13.6 % (ref 11.9–14.6)
WBC: 5.1 10*3/uL (ref 4.3–11.1)
nRBC: 0 10*3/uL (ref 0.0–0.2)

## 2021-05-15 NOTE — ED Notes (Signed)
I have reviewed discharge instructions with the patient.  The patient verbalized understanding.    Patient left ED via Discharge Method: ambulatory to Home with self.    Opportunity for questions and clarification provided.       Patient given 0 scripts.         To continue your aftercare when you leave the hospital, you may receive an automated call from our care team to check in on how you are doing.  This is a free service and part of our promise to provide the best care and service to meet your aftercare needs." If you have questions, or wish to unsubscribe from this service please call [phone].  Thank you for Choosing our [facility] Emergency Department.         Raylene Everts, RN  05/15/21 [phone]

## 2021-05-15 NOTE — ED Provider Notes (Signed)
Emergency Department Provider Note       PCP: No primary care provider on file.   Age: 71 y.o.   Sex: male     DISPOSITION       No diagnosis found.    Medical Decision Making     Complexity of Problems Addressed:  1 or more acute illnesses that pose a threat to life or bodily function.     Data Reviewed and Analyzed:  Category 1:   I independently ordered and reviewed each unique test.  I reviewed external records: ED visit note from an outside group.       Category 2:   I interpreted the X-rays x-ray shows no rib fracture or pneumothorax..    Category 3: Discussion of management or test interpretation.  71 year old African-American male who lives with his sister presents after a fall.  States that he fell approximately 2 months ago and struck his left chest.  He did not seek medical attention at that time.  He fell again yesterday striking the same area.  He now has pain to his left chest with movement and palpation.  No syncope.  No shortness of breath.  No neck pain or back pain.  Patient is a fairly poor historian.  Apparently he has diabetes but does not take medications as prescribed.  He is unable to state how long it has been since he is taking his medications.    Exam  Well-nourished well-developed African-American male awake alert no distress.  HEENT exam normocephalic atraumatic.  Pupils equal and reactive.  Oropharynx moist mucous membranes.  Neck is no JVD.  Cardiovascular regular rate and rhythm no murmurs.  Lungs are clear and symmetric.  Left chest has tenderness to the lateral aspect without bruising swelling crepitus.  Abdomen is soft nontender.  Extremities no edema.  Skin is warm and dry.    ED course  Lab work is unremarkable.  X-ray reassuring.  Suspect rib contusion.  Patient appears safe for discharge home.      Risk of Complications and/or Morbidity of Patient Management:  Shared medical decision making was utilized in creating the patients health plan today.         History      Gene King is a 71 y.o. male who presents to the Emergency Department with chief complaint of    Chief Complaint   Patient presents with    Rib Injury      HPI     Review of Systems    Physical Exam     Vitals signs and nursing note reviewed:  Vitals:    05/15/21 0830   BP: (!) 150/85   Pulse: 74   Resp: 16   Temp: 97.5 F (36.4 C)   TempSrc: Oral   SpO2: 99%   Weight: 155 lb (70.3 kg)   Height: 5\' 11"  (1.803 m)      Physical Exam     Procedures     Procedures    Orders Placed This Encounter   Procedures    XR RIBS LEFT INCLUDE CHEST (MIN 3 VIEWS)    Orthostatic Vital Signs        Medications - No data to display    New Prescriptions    No medications on file        No past medical history on file.     No past surgical history on file.     Social History     Socioeconomic History  Marital status: Single        Previous Medications    No medications on file        No results found for any visits on 05/15/21.     XR RIBS LEFT INCLUDE CHEST (MIN 3 VIEWS)    (Results Pending)                     Voice dictation software was used during the making of this note.  This software is not perfect and grammatical and other typographical errors may be present.  This note has not been completely proofread for errors.     Lance Sell, MD  05/15/21 1218

## 2021-05-15 NOTE — ED Triage Notes (Signed)
Pt arrives to ed via ems from home. Suffered a fall yesterday and hit left sided rib cage and now has pain. No loc, did not hit head, elbow into ribcage. A lot of falling and dizzy lately. Bgl 238 , 150/89 bp .

## 2022-10-11 DIAGNOSIS — M25559 Pain in unspecified hip: Secondary | ICD-10-CM

## 2022-10-11 NOTE — ED Provider Notes (Addendum)
 Emergency Department Provider Note       PCP: No primary care provider on file.   Age: 72 y.o.   Sex: male     DISPOSITION Decision To Discharge 10/12/2022 05:57:09 AM  Condition at Disposition: Data Unavailable       ICD-10-CM    1. Pain in joint involving pelvic region and thigh, unspecified laterality  M25.559         Medical Decision Making     72 year old male presents here with complaint of back pain.  Suspect pain is lumbar nature nontraumatic in onset.  Also complaining of some nontraumatic left lower leg pain, more rather to favor area.  No fractures were seen on x-ray.  Discussed findings with patient and encouraged over-the-counter remedies.     1 acute complicated illness or injury.  Over the counter drug management performed.  Parental controlled substances given in the ED.  Patient was discharged risks and benefits of hospitalization were considered.    I independently ordered and reviewed each unique test.  I reviewed external records: ED visit note from an outside group.  I reviewed external records: provider visit note from PCP.  I reviewed external records: provider visit note from outside specialist.              Voice dictation software was used during the making of this note.  This software is not perfect and grammatical and other typographical errors may be present.  This note has not been completely proofread for errors.    History     Patient is a 72 year old gentleman who comes here via EMS.  He is a poor historian and I have difficult time discerning what his complaint is.  States that he has had some leg pain that is mild and has been present for couple weeks.  He does not use any medications for this.  Also complaining of falling a little bit more recently.  He is ambulatory with a nonantalgic gait.    The history is provided by the patient. No language interpreter was used.     Physical Exam     Vitals signs and nursing note reviewed:  Vitals:    10/11/22 2245 10/11/22 2259   BP: (!)  92/56 (!) 96/58   Pulse: 78    Resp: 17    Temp: 99 F (37.2 C)    TempSrc: Oral    SpO2: 99%    Weight: 74.8 kg (165 lb)    Height: 1.829 m (6')       Physical Exam  Vitals and nursing note reviewed.   Constitutional:       General: He is not in acute distress.     Appearance: Normal appearance. He is not ill-appearing, toxic-appearing or diaphoretic.   HENT:      Head: Normocephalic and atraumatic.      Nose: Nose normal.      Mouth/Throat:      Mouth: Mucous membranes are moist.   Eyes:      Pupils: Pupils are equal, round, and reactive to light.   Cardiovascular:      Rate and Rhythm: Normal rate.   Pulmonary:      Effort: Pulmonary effort is normal. No respiratory distress.   Abdominal:      General: Abdomen is flat.      Palpations: Abdomen is soft.      Tenderness: There is no abdominal tenderness.   Musculoskeletal:         General: Normal  range of motion.      Cervical back: Normal range of motion. No rigidity.   Skin:     General: Skin is warm.   Neurological:      General: No focal deficit present.      Mental Status: He is alert.   Psychiatric:         Mood and Affect: Mood normal.        Procedures     Procedures    Orders Placed This Encounter   Procedures    XR CERVICAL SPINE (2-3 VIEWS)    XR LUMBAR SPINE (2-3 VIEWS)    XR TIBIA FIBULA LEFT (2 VIEWS)        Medications given during this emergency department visit:  Medications - No data to display    New Prescriptions    MELOXICAM  (MOBIC ) 15 MG TABLET    Take 1 tablet by mouth daily        No past medical history on file.     No past surgical history on file.     Social History     Socioeconomic History    Marital status: Single     Social Determinants of Health     Social Connections: Unknown (03/31/2019)    Received from [facility]    Social Connections     Frequency of Communication with Friends and Family: Not asked     Frequency of Social Gatherings with Friends and Family: Not asked   Intimate Partner Violence: Unknown (03/31/2019)     Received from [facility]    Intimate Partner Violence     Fear of Current or Ex-Partner: Not asked     Emotionally Abused: Not asked     Physically Abused: Not asked     Sexually Abused: Not asked   Housing Stability: Not At Risk (03/19/2020)    Received from [facility] Stability     Was there a time when you did not have a steady place to sleep: Not asked     Worried that the place you are staying is making you sick: Not asked        Previous Medications    No medications on file        Results for orders placed or performed during the hospital encounter of 10/12/22   XR CERVICAL SPINE (2-3 VIEWS)    Narrative    Clinical History/Indication for Exam:  pain : pain    RADIOGRAPHS OF THE CERVICAL SPINE 2 OR 3 VIEWS    INDICATION:  Neck pain    COMPARISON:  No relevant prior studies available.    FINDINGS:    Vertebrae:  There is no acute fracture or dislocation.    Disc spaces:  There are multilevel degenerative changes present.    Soft tissues:  Unremarkable.      Impression    1.  There is no acute fracture or dislocation.  2.  There are multilevel degenerative changes present.      Report signed on 10/12/2022 (04:50 Eastern Time)  Signed by: Kristi Nutting, M.D.  Reading Location: 396   XR LUMBAR SPINE (2-3 VIEWS)    Narrative    Clinical History/Indication for Exam:  pain : pain    RADIOGRAPHS OF THE LUMBOSACRAL SPINE 2 OR 3 VIEWS    INDICATION:  Lower back pain    COMPARISON:  No relevant prior studies available.    FINDINGS:    Vertebrae:  There is no  acute fracture or dislocation.    Sacrum/coccyx:  Unremarkable as visualized.  No acute fracture.    Disc spaces:  There are multilevel degenerative changes present.    Soft tissues:  Unremarkable.      Impression    1.  There is no acute fracture or dislocation.  2.  There are multilevel degenerative changes present.      Report signed on 10/12/2022 (04:49 Eastern Time)  Signed by: Kristi Nutting, M.D.  Reading Location: 396   XR TIBIA FIBULA LEFT  (2 VIEWS)    Narrative    Clinical History/Indication for Exam:  pain : pain    RADIOGRAPHS OF THE LEFT TIBIA AND FIBULA 2 VIEWS    INDICATION:  Left leg pain    COMPARISON:  No relevant prior studies available.    FINDINGS:    Bones/joints:  No acute fracture or dislocation.    Soft tissues:  Unremarkable.  No radiopaque foreign body.      Impression    No acute findings in the left tibia and fibula or surrounding soft  tissues.        Report signed on 10/12/2022 (04:52 Eastern Time)  Signed by: Kristi Nutting, M.D.  Reading Location: 396         XR CERVICAL SPINE (2-3 VIEWS)   Final Result   1.  There is no acute fracture or dislocation.   2.  There are multilevel degenerative changes present.         Report signed on 10/12/2022 (04:50 Eastern Time)   Signed by: Kristi Nutting, M.D.   Reading Location: 396      XR LUMBAR SPINE (2-3 VIEWS)   Final Result   1.  There is no acute fracture or dislocation.   2.  There are multilevel degenerative changes present.         Report signed on 10/12/2022 (04:49 Eastern Time)   Signed by: Kristi Nutting, M.D.   Reading Location: 396      XR TIBIA FIBULA LEFT (2 VIEWS)   Final Result   No acute findings in the left tibia and fibula or surrounding soft   tissues.            Report signed on 10/12/2022 (04:52 Eastern Time)   Signed by: Kristi Nutting, M.D.   Reading Location: 396                   No results for input(s): COVID19 in the last 72 hours.       Linnette Sharper, PA  10/12/22 0600       Linnette Sharper, [place]  10/12/22 [phone]

## 2022-10-11 NOTE — ED Triage Notes (Signed)
 Pt presents to the ED via [facility] from home c/o sciatica nerve pain x1 week and neck stiffness.     BP 128/66  HR 80  SPO2 97% on room air  BGL 105

## 2022-10-12 ENCOUNTER — Inpatient Hospital Stay: Admit: 2022-10-12 | Discharge: 2022-10-12 | Disposition: A | Discharge status: Home / Self Care | Payer: MEDICARE

## 2022-10-12 DIAGNOSIS — M25559 Pain in unspecified hip: Secondary | ICD-10-CM

## 2022-10-12 MED ORDER — MELOXICAM 15 MG PO TABS
15 | ORAL_TABLET | Freq: Every day | ORAL | 1 refills | Status: AC
Start: 2022-10-12 — End: ?

## 2022-10-12 NOTE — Discharge Instructions (Addendum)
 No abnormalities were seen on your x-rays.  Going to treat you symptomatically.

## 2023-09-30 ENCOUNTER — Inpatient Hospital Stay
Admit: 2023-09-30 | Discharge: 2023-10-01 | Disposition: A | Discharge status: Home / Self Care | Payer: MEDICARE | Arrived: AM | Attending: Emergency Medicine

## 2023-09-30 ENCOUNTER — Emergency Department: Admit: 2023-09-30 | Payer: MEDICARE

## 2023-09-30 DIAGNOSIS — F0390 Unspecified dementia without behavioral disturbance: Principal | ICD-10-CM

## 2023-09-30 DIAGNOSIS — E860 Dehydration: Secondary | ICD-10-CM

## 2023-09-30 LAB — CBC WITH AUTO DIFFERENTIAL
Basophils %: 0.6 % (ref 0.0–2.0)
Basophils Absolute: 0.04 K/UL (ref 0.00–0.20)
Eosinophils %: 2.8 % (ref 0.5–7.8)
Eosinophils Absolute: 0.2 K/UL (ref 0.00–0.80)
Hematocrit: 36.3 % — ABNORMAL LOW (ref 41.1–50.3)
Hemoglobin: 12.1 g/dL — ABNORMAL LOW (ref 13.6–17.2)
Immature Granulocytes %: 0.4 % (ref 0.0–5.0)
Immature Granulocytes Absolute: 0.03 K/UL (ref 0.0–0.5)
Lymphocytes %: 14.1 % (ref 13.0–44.0)
Lymphocytes Absolute: 1.01 K/UL (ref 0.50–4.60)
MCH: 27.8 pg (ref 26.1–32.9)
MCHC: 33.3 g/dL (ref 31.4–35.0)
MCV: 83.4 FL (ref 82–102)
MPV: 9.4 FL (ref 9.4–12.3)
Monocytes %: 7.5 % (ref 4.0–12.0)
Monocytes Absolute: 0.54 K/UL (ref 0.10–1.30)
Neutrophils %: 74.6 % (ref 43.0–78.0)
Neutrophils Absolute: 5.34 K/UL (ref 1.70–8.20)
Platelets: 212 K/uL (ref 150–450)
RBC: 4.35 M/uL (ref 4.23–5.6)
RDW: 14.9 % — ABNORMAL HIGH (ref 11.9–14.6)
WBC: 7.2 K/uL (ref 4.3–11.1)
nRBC: 0 K/uL (ref 0.0–0.2)

## 2023-09-30 LAB — COMPREHENSIVE METABOLIC PANEL
ALT: 32 U/L (ref 8–55)
AST: 43 U/L — ABNORMAL HIGH (ref 15–37)
Albumin/Globulin Ratio: 0.9 — ABNORMAL LOW (ref 1.0–1.9)
Albumin: 3.3 g/dL (ref 3.2–4.6)
Alk Phosphatase: 102 U/L (ref 40–129)
Anion Gap: 10 mmol/L (ref 7–16)
BUN: 14 mg/dL (ref 8–23)
CO2: 24 mmol/L (ref 20–29)
Calcium: 9.6 mg/dL (ref 8.8–10.2)
Chloride: 105 mmol/L (ref 98–107)
Creatinine: 1.5 mg/dL — ABNORMAL HIGH (ref 0.80–1.30)
Est, Glom Filt Rate: 49 ml/min/1.73m2 — ABNORMAL LOW (ref >60)
Globulin: 3.5 g/dL (ref 2.3–3.5)
Glucose: 109 mg/dL — ABNORMAL HIGH (ref 70–99)
Potassium: 4.6 mmol/L (ref 3.5–5.1)
Sodium: 139 mmol/L (ref 136–145)
Total Bilirubin: 0.3 mg/dL (ref 0.0–1.2)
Total Protein: 6.9 g/dL (ref 6.3–8.2)

## 2023-09-30 LAB — ETHANOL: Ethanol Lvl: <11 mg/dL

## 2023-09-30 LAB — TSH: TSH, 3rd Generation: 1.56 u[IU]/mL (ref 0.270–4.200)

## 2023-09-30 LAB — POCT GLUCOSE: POC Glucose: 99 mg/dL (ref 65–100)

## 2023-09-30 LAB — AMMONIA: Ammonia: 16 umol/L (ref 16–60)

## 2023-09-30 LAB — CK: Total CK: 218 U/L — ABNORMAL HIGH (ref 21–215)

## 2023-09-30 MED ORDER — SODIUM CHLORIDE 0.9 % IV BOLUS
0.9 | Freq: Once | INTRAVENOUS | Status: AC
Start: 2023-09-30 — End: 2023-09-30
  Administered 2023-09-30: 22:00:00 1000 mL via INTRAVENOUS

## 2023-09-30 NOTE — ED Notes (Signed)
 Patient mobility status ambulates with mild difficulty.     I have reviewed discharge instructions with the patient and spouse.  The patient and spouse verbalized understanding.    Patient left ED via Discharge Method: wheelchair to Home with Spouse.    Opportunity for questions and clarification provided.     Patient given 0 scripts.           Charlott Kung, RN  09/30/23 2140

## 2023-09-30 NOTE — Discharge Instructions (Addendum)
 Schedule close follow-up with PCP, Neurologist.  Remain hydrated. Please return if symptoms worsen or progress in any way including worsening confusion, altered mental status, or if he develops chest pain, shortness of breath, seizure like activity, focal weakness, facial droop, slurred speech, vomiting, etc.    Stop cocaine use or any other illicit drug use

## 2023-09-30 NOTE — ED Provider Notes (Signed)
 Emergency Department Provider Note       [facility] EMERGENCY DEPT   PCP: No primary care provider on file.   Age: 73 y.o.   Sex: male     DISPOSITION Decision To Discharge 09/30/2023 08:09:50 PM    ICD-10-CM    1. Dementia, unspecified dementia severity, unspecified dementia type, unspecified whether behavioral, psychotic, or mood disturbance or anxiety (HCC)  F03.90 [facility]      2. AKI (acute kidney injury)  N17.9       3. Dehydration  E86.0       4. Transient confusion  R41.0 [facility]      5. 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  Patient significant other states that he had a mechanical trip and fall earlier today but did not hit his head.  Patient reportedly landed on his left hip but is not having any left hip pain.  Denies any back pain.  Patient denies LOC.    Vital signs stable.  Afebrile.  O2 sat 96% on room air.  Differential diagnosis includes but is not limited to pneumonia, closed head injury, subdural hematoma, epidural hematoma, subarachnoid hemorrhage, CVA, electrolyte dramality, dehydration, UTI, rhabdomyolysis, etc.    1 L IV fluid bolus given.  Creatinine 1.50.  Glucose 109.  CK 218.  Alcohol less than 11.  TSH within normal limits.  WBC 7.2.  H&H 12.1/36.3; most recent for comparison 11.3/34.4.  Ammonia within normal limits at 16.  UA negative for UTI.  UDS positive for cocaine.  Patient does endorse using crack cocaine 1 to 2 days ago.  Chest x-ray with no acute infiltrate consolidation noted.  CT head with no acute findings noted.    Patient in transit confusion likely due to recent cocaine use.  Normal neuroexam.  No indication for CVA at this time.  Patient alert and oriented x 4.  Patient at baseline mental status.  No focal deficits.  No indication for additional workup.  Patient  states he feels fine and at baseline.  Patient instructed to avoid crack cocaine use and instructed on need for close follow-up primary care physician.  Strict return precautions given.    ED Course as of 10/01/23 1124   Fri Sep 30, 2023   1749 Chest X-ray FINDINGS: The lungs are clear. There are no infiltrates or effusions.  The heart  size is normal.  The bony thorax is intact.       IMPRESSION:  No acute findings in the chest      [DF]   2009 CT head IMPRESSION:  No CT evidence of acute intracranial abnormality.     Chronic microvascular ischemic change with associated parenchymal volume loss.   [DF]      ED Course User Index  [DF] Flowers, Madhav Mohon Reford Raddle., MD     1 or more acute illnesses that pose a threat to life or bodily function.   1 or more chronic illnesses with a severe exacerbation or progression.  Over the counter drug management performed.  Chronic medical problems impacting care include HTN.  Shared medical decision making was utilized in creating the patients health plan today.  I independently ordered and reviewed each unique test.    I reviewed external records:  provider visit note from PCP.  I reviewed external records: provider visit note from outside specialist.  I reviewed external records: previous lab results from outside ED.   The patients assessment required an independent historian: Patient's wife provides additional historical information in regards to past medical history and recent symptoms..  The reason they were needed is important historical information not provided by the patient.    I interpreted the X-rays chest ray with no infiltrate or consolidation noted.  Agree with radiologist..  I interpreted the CT Scan CT head with no acute intracranial abnormality noted.  Agree w/ radiologist..  ED provider's independent EKG interpretation heart rate 79.  Sinus rhythm.  No ST elevation noted LAFB            History     73 year old male with history of T2DM, HTN, [employer] pneumonia,  dementia, hepatitis C presents via EMS from home with report of confusion from baseline.  Patient significant other states that he had a mechanical trip and fall earlier today but did not hit his head.  Patient reportedly landed on his left hip but is not having any left hip pain.  Denies any back pain.  Patient denies LOC.  Wife states that patient was feeling at his baseline after this occurred and then this afternoon he became more altered.  Denies facial droop, slurred speech, focal weakness, numbness, tingling.  Denies any fever, chills, nausea, vomiting, chest pain, shortness breath, abdominal pain, seizure-like activity.  Denies any alcohol or illicit drug use.  Denies any back pain.    The history is provided by the patient and the spouse. No language interpreter was used.     Physical Exam     Vitals signs and nursing note reviewed:  Vitals:    09/30/23 1645 09/30/23 1800 09/30/23 1944 09/30/23 2114   BP: 128/69 (!) 155/88 (!) 149/108 (!) 148/79   Pulse: 67 71 77 77   Resp: 15 15 16 16    Temp:    98.7 F (37.1 C)   TempSrc:    Oral   SpO2: 96% 95% 96% 96%   Weight:       Height:          Physical Exam  Vitals and nursing note reviewed.   Constitutional:       Appearance: He is well-developed.      Comments: Patient well-appearing in no acute distress.   HENT:      Head: Normocephalic and atraumatic.      Comments: Atraumatic.  No evidence of basilar skull fracture.     Mouth/Throat:      Mouth: Mucous membranes are moist.   Eyes:      Extraocular Movements: Extraocular movements intact.      Pupils: Pupils are equal, round, and reactive to light.   Neck:      Comments: No midline C-spine tenderness.  No step-off.  Full range of motion  Cardiovascular:      Rate and Rhythm: Normal rate.      Heart sounds: Normal heart sounds.   Pulmonary:      Effort: Pulmonary effort is normal.      Breath sounds: Normal breath sounds.   Abdominal:      General: Bowel sounds are normal.      Palpations: Abdomen is soft.       Tenderness: There is no abdominal tenderness.      Comments: Soft, nontender, nondistended with no rebound or guarding.   Musculoskeletal:  General: Normal range of motion.      Cervical back: Normal range of motion. No rigidity.      Comments: Full range of motion of bilateral upper and lower extremities.  Bilateral hips nontender to palpation.  No deformities noted. NVID.  No midline T-spine or L-spine tenderness palpation.  No step-off.    Skin:     General: Skin is warm.      Findings: No erythema or rash.   Neurological:      Mental Status: He is alert and oriented to person, place, and time. Mental status is at baseline.      Comments: Mild dementia at baseline.  Strength 5 out of 5 throughout.  Normal sensory exam.  No drift.  No dysarthria.  No aphasia.  No focal deficits.  No saddle anesthesia.  Normal DTRs   Psychiatric:         Mood and Affect: Mood normal.         Behavior: Behavior normal.          Procedures     Procedures    Orders placed during this emergency department visit:     Orders Placed This Encounter   Procedures    XR CHEST PORTABLE    CT HEAD WO CONTRAST    CBC with Auto Differential    CMP    ETOH    Urinalysis    Urine Drug Screen    TSH    Ammonia    CK    [facility]    POCT Glucose    EKG 12 Lead        Medications given during this emergency department visit:     Medications   sodium chloride  0.9 % bolus 1,000 mL (0 mLs IntraVENous Stopped 09/30/23 1940)       New prescriptions:     Discharge Medication List as of 09/30/2023  9:28 PM           Past History and Complexity:     No past medical history on file.     No past surgical history on file.     Social History     Socioeconomic History    Marital status: Single   Substance and Sexual Activity    Alcohol use: Never     Social Drivers of Health     Social Connections: Unknown (03/31/2019)    Received from [facility]    Social Connections     Frequency of Communication with Friends and Family: Not  asked     Frequency of Social Gatherings with Friends and Family: Not asked   Intimate Partner Violence: Unknown (03/31/2019)    Received from [facility]    Intimate Partner Violence     Fear of Current or Ex-Partner: Not asked     Emotionally Abused: Not asked     Physically Abused: Not asked     Sexually Abused: Not asked   Housing Stability: Not At Risk (03/19/2020)    Received from [facility]     Was there a time when you did not have a steady place to sleep: Unrecognized value     Worried that the place you are staying is making you sick: Unrecognized value        Discharge Medication List as of 09/30/2023  9:28 PM        CONTINUE these medications which have NOT CHANGED    Details  meloxicam  (MOBIC ) 15 MG tablet Take 1 tablet by mouth daily, Disp-90 tablet, R-1Print              Results from this emergency department visit:      Results for orders placed or performed during the hospital encounter of 09/30/23   XR CHEST PORTABLE    Narrative    Chest X-ray    INDICATION: Syncope    COMPARISON: Chest radiograph dated 07/23/2014.    TECHNIQUE: AP/PA view of the chest was obtained.    FINDINGS: The lungs are clear. There are no infiltrates or effusions.  The heart  size is normal.  The bony thorax is intact.        Impression    No acute findings in the chest      Electronically signed by Jorene Ivy MD   CT HEAD WO CONTRAST    Narrative    Head CT    INDICATION: AMS    TECHNIQUE: Multiple 2D axial images obtained through the brain without  intravenous contrast.  Radiation dose reduction techniques were used for this  study:  All CT scans performed at this facility use one or all of the following:  Automated exposure control, adjustment of the mA and/or kVp according to  patient's size, iterative reconstruction.    COMPARISON: None    FINDINGS:  There is hypoattenuation in the bilateral periventricular cerebral white matter.  There is enlargement of the ventricles and sulcal spaces  bilaterally suggesting  parenchymal atrophy. There is no CT evidence of acute hemorrhage or infarction.     There are no extra-axial fluid collections. No masses are seen. The sinuses are  clear. There are no bony lesions.      Impression    No CT evidence of acute intracranial abnormality.    Chronic microvascular ischemic change with associated parenchymal volume loss.    Electronically signed by Jorene Ivy MD   CBC with Auto Differential   Result Value Ref Range    WBC 7.2 4.3 - 11.1 K/uL    RBC 4.35 4.23 - 5.6 M/uL    Hemoglobin 12.1 (L) 13.6 - 17.2 g/dL    Hematocrit 63.6 (L) 41.1 - 50.3 %    MCV 83.4 82 - 102 FL    MCH 27.8 26.1 - 32.9 PG    MCHC 33.3 31.4 - 35.0 g/dL    RDW 85.0 (H) 88.0 - 14.6 %    Platelets 212 150 - 450 K/uL    MPV 9.4 9.4 - 12.3 FL    nRBC 0.00 0.0 - 0.2 K/uL    Differential Type AUTOMATED      Neutrophils % 74.6 43.0 - 78.0 %    Lymphocytes % 14.1 13.0 - 44.0 %    Monocytes % 7.5 4.0 - 12.0 %    Eosinophils % 2.8 0.5 - 7.8 %    Basophils % 0.6 0.0 - 2.0 %    Immature Granulocytes % 0.4 0.0 - 5.0 %    Neutrophils Absolute 5.34 1.70 - 8.20 K/UL    Lymphocytes Absolute 1.01 0.50 - 4.60 K/UL    Monocytes Absolute 0.54 0.10 - 1.30 K/UL    Eosinophils Absolute 0.20 0.00 - 0.80 K/UL    Basophils Absolute 0.04 0.00 - 0.20 K/UL    Immature Granulocytes Absolute 0.03 0.0 - 0.5 K/UL   CMP   Result Value Ref Range    Sodium 139 136 - 145 mmol/L    Potassium 4.6 3.5 - 5.1 mmol/L  Chloride 105 98 - 107 mmol/L    CO2 24 20 - 29 mmol/L    Anion Gap 10 7 - 16 mmol/L    Glucose 109 (H) 70 - 99 mg/dL    BUN 14 8 - 23 MG/DL    Creatinine 8.49 (H) 0.80 - 1.30 MG/DL    Est, Glom Filt Rate 49 (L) >60 ml/min/1.32m2    Calcium 9.6 8.8 - 10.2 MG/DL    Total Bilirubin 0.3 0.0 - 1.2 MG/DL    ALT 32 8 - 55 U/L    AST 43 (H) 15 - 37 U/L    Alk Phosphatase 102 40 - 129 U/L    Total Protein 6.9 6.3 - 8.2 g/dL    Albumin 3.3 3.2 - 4.6 g/dL    Globulin 3.5 2.3 - 3.5 g/dL    Albumin/Globulin Ratio 0.9 (L) 1.0 - 1.9      ETOH   Result Value Ref Range    Ethanol Lvl <11 MG/DL   Urinalysis   Result Value Ref Range    Color, UA YELLOW/STRAW      Appearance CLEAR      Specific Gravity, UA 1.014 1.001 - 1.023      pH, Urine 7.0 5.0 - 9.0      Protein, UA Negative NEG mg/dL    Glucose, Ur Negative NEG mg/dL    Ketones, Urine Negative NEG mg/dL    Bilirubin, Urine Negative NEG      Blood, Urine Negative NEG      Urobilinogen, Urine 1.0 0.2 - 1.0 EU/dL    Nitrite, Urine Negative NEG      Leukocyte Esterase, Urine TRACE (A) NEG      WBC, UA 5-10 (A) U4 /hpf    RBC, UA 0-5 U5 /hpf    Epithelial Cells, UA 0-5 U5 /hpf    BACTERIA, URINE Negative NEG /hpf    Hyaline Casts, UA 5-10 /lpf   Urine Drug Screen   Result Value Ref Range    Amphetamine, Urine Negative NEG      Barbiturates, Urine Negative NEG      Benzodiazepines, Urine Negative NEG      Cocaine, Urine Positive (A) NEG      Methadone, Urine Negative NEG      Opiates, Urine Negative NEG      Phencyclidine, Urine Negative NEG      THC, TH-Cannabinol, Urine Negative NEG      Fentanyl Negative NEG      pH, Urine 6.7 4.5 - 8.0      Drug Screen Comment:        Specimen analysis was performed without chain of custody handling. These results should be used for medical purposes only and not for legal or employment purposes. Unconfirmed screening results must not be used for non-medical purposes.   TSH   Result Value Ref Range    TSH, 3rd Generation 1.560 0.270 - 4.200 uIU/mL   Ammonia   Result Value Ref Range    Ammonia 16 16 - 60 umol/L   CK   Result Value Ref Range    Total CK 218 (H) 21 - 215 U/L   POCT Glucose   Result Value Ref Range    POC Glucose 99 65 - 100 mg/dL    Performed by: [facility]    EKG 12 Lead   Result Value Ref Range    Ventricular Rate 79 BPM    Atrial Rate 79 BPM    P-R Interval 181 ms  QRS Duration 114 ms    Q-T Interval 389 ms    QTc Calculation (Bazett) 446 ms    P Axis 77 degrees    R Axis -49 degrees    T Axis 88 degrees    Diagnosis       Sinus  rhythm  Consider left atrial enlargement  LAD, consider left anterior fascicular block  Abnormal R-wave progression, early transition    Confirmed by GWYNDOLYN AGENT ([id number]) on 10/01/2023 6:26:00 AM           CT HEAD WO CONTRAST   Final Result   No CT evidence of acute intracranial abnormality.      Chronic microvascular ischemic change with associated parenchymal volume loss.      Electronically signed by Jorene Ivy MD      XR CHEST PORTABLE   Final Result   No acute findings in the chest         Electronically signed by Jorene Ivy MD                   No results for input(s): COVID19 in the last 72 hours.     Voice dictation software was used during the making of this note.  This software is not perfect and grammatical and other typographical errors may be present.  This note has not been completely proofread for errors.     Flowers, Ardyth Reford Raddle., MD  10/01/23 [phone]

## 2023-09-30 NOTE — ED Triage Notes (Signed)
 PT BIB EMS from home after he slip & fall, didn't hit his head, no LOC this AM. This afternoon his wife reports that he is altered from his baseline. Hx of dementia. Vitals: BP 140/80; HR 88; 98% on RA, BGL 173

## 2023-10-01 LAB — URINE DRUG SCREEN
Amphetamine, Urine: NEGATIVE
Barbiturates, Urine: NEGATIVE
Benzodiazepines, Urine: NEGATIVE
Cocaine, Urine: POSITIVE — AB
Fentanyl: NEGATIVE
Methadone, Urine: NEGATIVE
Opiates, Urine: NEGATIVE
Phencyclidine, Urine: NEGATIVE
THC, TH-Cannabinol, Urine: NEGATIVE
pH, Urine: 6.7 (ref 4.5–8.0)

## 2023-10-01 LAB — EKG 12-LEAD
Atrial Rate: 79 {beats}/min
P Axis: 77 degrees
P-R Interval: 181 ms
Q-T Interval: 389 ms
QRS Duration: 114 ms
QTc Calculation (Bazett): 446 ms
R Axis: -49 degrees
T Axis: 88 degrees
Ventricular Rate: 79 {beats}/min

## 2023-10-01 LAB — URINALYSIS
BACTERIA, URINE: NEGATIVE /HPF
Bilirubin, Urine: NEGATIVE
Blood, Urine: NEGATIVE
Glucose, Ur: NEGATIVE mg/dL
Ketones, Urine: NEGATIVE mg/dL
Nitrite, Urine: NEGATIVE
Protein, UA: NEGATIVE mg/dL
Specific Gravity, UA: 1.014 (ref 1.001–1.023)
Urobilinogen, Urine: 1 EU/dL (ref 0.2–1.0)
pH, Urine: 7 (ref 5.0–9.0)
